# Patient Record
Sex: Female | Born: 1965
Health system: Southern US, Community
[De-identification: ages and names within clinical notes are randomized; demographics above are authoritative.]

## PROBLEM LIST (undated history)

## (undated) DIAGNOSIS — E785 Hyperlipidemia, unspecified: Secondary | ICD-10-CM

## (undated) DIAGNOSIS — E042 Nontoxic multinodular goiter: Secondary | ICD-10-CM

## (undated) DIAGNOSIS — E119 Type 2 diabetes mellitus without complications: Secondary | ICD-10-CM

## (undated) DIAGNOSIS — I10 Essential (primary) hypertension: Secondary | ICD-10-CM

## (undated) DIAGNOSIS — N6019 Diffuse cystic mastopathy of unspecified breast: Secondary | ICD-10-CM

## (undated) DIAGNOSIS — I509 Heart failure, unspecified: Secondary | ICD-10-CM

## (undated) HISTORY — DX: Essential (primary) hypertension: I10

## (undated) HISTORY — PX: TUBAL LIGATION: SHX77

## (undated) HISTORY — PX: ABDOMINAL HYSTERECTOMY: SHX81

## (undated) HISTORY — DX: Heart failure, unspecified: I50.9

## (undated) HISTORY — DX: Hyperlipidemia, unspecified: E78.5

---

## 2004-02-08 ENCOUNTER — Emergency Department: Payer: Self-pay | Admitting: Emergency Medicine

## 2004-07-10 ENCOUNTER — Emergency Department: Payer: Self-pay | Admitting: Emergency Medicine

## 2006-12-07 ENCOUNTER — Emergency Department: Payer: Self-pay | Admitting: Emergency Medicine

## 2007-12-30 ENCOUNTER — Ambulatory Visit: Payer: Self-pay | Admitting: Ophthalmology

## 2011-02-27 ENCOUNTER — Ambulatory Visit: Payer: Self-pay | Admitting: Internal Medicine

## 2011-03-21 ENCOUNTER — Other Ambulatory Visit: Payer: Self-pay | Admitting: General Surgery

## 2011-03-21 DIAGNOSIS — R922 Inconclusive mammogram: Secondary | ICD-10-CM

## 2011-03-21 DIAGNOSIS — R928 Other abnormal and inconclusive findings on diagnostic imaging of breast: Secondary | ICD-10-CM

## 2011-04-24 ENCOUNTER — Ambulatory Visit
Admission: RE | Admit: 2011-04-24 | Discharge: 2011-04-24 | Disposition: A | Discharge status: Home / Self Care | Payer: BC Managed Care – PPO | Source: Ambulatory Visit | Attending: General Surgery | Admitting: General Surgery

## 2011-04-24 DIAGNOSIS — R928 Other abnormal and inconclusive findings on diagnostic imaging of breast: Secondary | ICD-10-CM

## 2011-04-24 DIAGNOSIS — R922 Inconclusive mammogram: Secondary | ICD-10-CM

## 2011-04-24 MED ORDER — GADOBENATE DIMEGLUMINE 529 MG/ML IV SOLN
19.0000 mL | Freq: Once | INTRAVENOUS | Status: AC | PRN
  Administered 2011-04-24: 19 mL via INTRAVENOUS

## 2011-04-30 ENCOUNTER — Ambulatory Visit: Payer: Self-pay | Admitting: Surgery

## 2012-12-11 ENCOUNTER — Emergency Department: Payer: Self-pay | Admitting: Internal Medicine

## 2013-07-20 ENCOUNTER — Emergency Department: Payer: Self-pay | Admitting: Emergency Medicine

## 2013-07-20 LAB — COMPREHENSIVE METABOLIC PANEL
ALT: 16 U/L (ref 12–78)
ANION GAP: 8 (ref 7–16)
AST: 17 U/L (ref 15–37)
Albumin: 3.4 g/dL (ref 3.4–5.0)
Alkaline Phosphatase: 69 U/L
BUN: 13 mg/dL (ref 7–18)
Bilirubin,Total: 0.2 mg/dL (ref 0.2–1.0)
CALCIUM: 8.4 mg/dL — AB (ref 8.5–10.1)
CHLORIDE: 109 mmol/L — AB (ref 98–107)
CO2: 24 mmol/L (ref 21–32)
CREATININE: 0.81 mg/dL (ref 0.60–1.30)
GLUCOSE: 115 mg/dL — AB (ref 65–99)
Osmolality: 282 (ref 275–301)
POTASSIUM: 4 mmol/L (ref 3.5–5.1)
SODIUM: 141 mmol/L (ref 136–145)
Total Protein: 7.3 g/dL (ref 6.4–8.2)

## 2013-07-20 LAB — CBC WITH DIFFERENTIAL/PLATELET
Basophil #: 0.3 10*3/uL — ABNORMAL HIGH (ref 0.0–0.1)
Basophil %: 3 %
EOS PCT: 0.5 %
Eosinophil #: 0.1 10*3/uL (ref 0.0–0.7)
HCT: 33.8 % — AB (ref 35.0–47.0)
HGB: 10.4 g/dL — ABNORMAL LOW (ref 12.0–16.0)
LYMPHS ABS: 4.7 10*3/uL — AB (ref 1.0–3.6)
LYMPHS PCT: 48.4 %
MCH: 24 pg — AB (ref 26.0–34.0)
MCHC: 30.7 g/dL — ABNORMAL LOW (ref 32.0–36.0)
MCV: 78 fL — AB (ref 80–100)
MONOS PCT: 8.8 %
Monocyte #: 0.9 x10 3/mm (ref 0.2–0.9)
NEUTROS ABS: 3.8 10*3/uL (ref 1.4–6.5)
Neutrophil %: 39.3 %
PLATELETS: 399 10*3/uL (ref 150–440)
RBC: 4.32 10*6/uL (ref 3.80–5.20)
RDW: 17.6 % — ABNORMAL HIGH (ref 11.5–14.5)
WBC: 9.7 10*3/uL (ref 3.6–11.0)

## 2013-07-20 LAB — URINALYSIS, COMPLETE
BILIRUBIN, UR: NEGATIVE
BLOOD: NEGATIVE
Bacteria: NONE SEEN
Glucose,UR: NEGATIVE mg/dL (ref 0–75)
Ketone: NEGATIVE
LEUKOCYTE ESTERASE: NEGATIVE
Nitrite: NEGATIVE
Ph: 7 (ref 4.5–8.0)
Protein: NEGATIVE
SPECIFIC GRAVITY: 1.018 (ref 1.003–1.030)
Squamous Epithelial: 1

## 2013-07-20 LAB — GC/CHLAMYDIA PROBE AMP

## 2013-07-20 LAB — LIPASE, BLOOD: LIPASE: 152 U/L (ref 73–393)

## 2013-07-20 LAB — WET PREP, GENITAL

## 2013-07-29 ENCOUNTER — Ambulatory Visit: Payer: Self-pay | Admitting: Obstetrics and Gynecology

## 2013-07-29 LAB — BASIC METABOLIC PANEL
Anion Gap: 8 (ref 7–16)
BUN: 12 mg/dL (ref 7–18)
CREATININE: 0.75 mg/dL (ref 0.60–1.30)
Calcium, Total: 8.6 mg/dL (ref 8.5–10.1)
Chloride: 111 mmol/L — ABNORMAL HIGH (ref 98–107)
Co2: 22 mmol/L (ref 21–32)
EGFR (African American): >60
GLUCOSE: 86 mg/dL (ref 65–99)
Osmolality: 280 (ref 275–301)
Potassium: 3.9 mmol/L (ref 3.5–5.1)
SODIUM: 141 mmol/L (ref 136–145)

## 2013-07-29 LAB — HEMOGLOBIN: HGB: 10.6 g/dL — ABNORMAL LOW (ref 12.0–16.0)

## 2013-08-04 ENCOUNTER — Inpatient Hospital Stay: Payer: Self-pay

## 2013-08-05 LAB — BASIC METABOLIC PANEL
Anion Gap: 9 (ref 7–16)
BUN: 6 mg/dL — AB (ref 7–18)
Calcium, Total: 8.1 mg/dL — ABNORMAL LOW (ref 8.5–10.1)
Chloride: 110 mmol/L — ABNORMAL HIGH (ref 98–107)
Co2: 23 mmol/L (ref 21–32)
Creatinine: 0.73 mg/dL (ref 0.60–1.30)
EGFR (African American): >60
EGFR (Non-African Amer.): >60
Glucose: 128 mg/dL — ABNORMAL HIGH (ref 65–99)
Osmolality: 282 (ref 275–301)
Potassium: 4.2 mmol/L (ref 3.5–5.1)
SODIUM: 142 mmol/L (ref 136–145)

## 2013-08-05 LAB — HEMATOCRIT: HCT: 33.2 % — ABNORMAL LOW (ref 35.0–47.0)

## 2013-08-06 LAB — PATHOLOGY REPORT

## 2013-08-10 ENCOUNTER — Inpatient Hospital Stay: Payer: Self-pay | Admitting: Obstetrics and Gynecology

## 2013-08-10 LAB — CBC WITH DIFFERENTIAL/PLATELET
BASOS ABS: 0.1 10*3/uL (ref 0.0–0.1)
BASOS PCT: 1.3 %
Eosinophil #: 0 10*3/uL (ref 0.0–0.7)
Eosinophil %: 0.3 %
HCT: 38 % (ref 35.0–47.0)
HGB: 11.8 g/dL — AB (ref 12.0–16.0)
LYMPHS ABS: 2.2 10*3/uL (ref 1.0–3.6)
Lymphocyte %: 31.3 %
MCH: 24.2 pg — ABNORMAL LOW (ref 26.0–34.0)
MCHC: 30.9 g/dL — ABNORMAL LOW (ref 32.0–36.0)
MCV: 78 fL — ABNORMAL LOW (ref 80–100)
MONO ABS: 0.8 x10 3/mm (ref 0.2–0.9)
MONOS PCT: 11.4 %
NEUTROS ABS: 3.9 10*3/uL (ref 1.4–6.5)
Neutrophil %: 55.7 %
Platelet: 439 10*3/uL (ref 150–440)
RBC: 4.86 10*6/uL (ref 3.80–5.20)
RDW: 17.6 % — AB (ref 11.5–14.5)
WBC: 7.1 10*3/uL (ref 3.6–11.0)

## 2013-08-10 LAB — COMPREHENSIVE METABOLIC PANEL
ALK PHOS: 62 U/L
Albumin: 3.6 g/dL (ref 3.4–5.0)
Anion Gap: 9 (ref 7–16)
BUN: 10 mg/dL (ref 7–18)
Bilirubin,Total: 0.6 mg/dL (ref 0.2–1.0)
CALCIUM: 9.1 mg/dL (ref 8.5–10.1)
CHLORIDE: 105 mmol/L (ref 98–107)
CO2: 24 mmol/L (ref 21–32)
CREATININE: 0.87 mg/dL (ref 0.60–1.30)
EGFR (African American): >60
EGFR (Non-African Amer.): >60
Glucose: 112 mg/dL — ABNORMAL HIGH (ref 65–99)
Osmolality: 275 (ref 275–301)
POTASSIUM: 3.7 mmol/L (ref 3.5–5.1)
SGOT(AST): 12 U/L — ABNORMAL LOW (ref 15–37)
SGPT (ALT): 16 U/L
SODIUM: 138 mmol/L (ref 136–145)
TOTAL PROTEIN: 8 g/dL (ref 6.4–8.2)

## 2013-08-10 LAB — URINALYSIS, COMPLETE
Bacteria: NONE SEEN
Bilirubin,UR: NEGATIVE
Blood: NEGATIVE
GLUCOSE, UR: NEGATIVE mg/dL (ref 0–75)
LEUKOCYTE ESTERASE: NEGATIVE
Nitrite: NEGATIVE
Ph: 6 (ref 4.5–8.0)
Protein: NEGATIVE
Specific Gravity: 1.015 (ref 1.003–1.030)
Squamous Epithelial: 2

## 2013-08-10 LAB — LIPASE, BLOOD: Lipase: 69 U/L — ABNORMAL LOW (ref 73–393)

## 2013-08-11 LAB — CBC WITH DIFFERENTIAL/PLATELET
BASOS PCT: 0.4 %
Basophil #: 0 10*3/uL (ref 0.0–0.1)
Eosinophil #: 0 10*3/uL (ref 0.0–0.7)
Eosinophil %: 0.7 %
HCT: 32.5 % — ABNORMAL LOW (ref 35.0–47.0)
HGB: 10 g/dL — AB (ref 12.0–16.0)
LYMPHS PCT: 38.9 %
Lymphocyte #: 2.1 10*3/uL (ref 1.0–3.6)
MCH: 24 pg — ABNORMAL LOW (ref 26.0–34.0)
MCHC: 30.6 g/dL — ABNORMAL LOW (ref 32.0–36.0)
MCV: 78 fL — AB (ref 80–100)
MONOS PCT: 16.6 %
Monocyte #: 0.9 x10 3/mm (ref 0.2–0.9)
NEUTROS ABS: 2.3 10*3/uL (ref 1.4–6.5)
Neutrophil %: 43.4 %
PLATELETS: 350 10*3/uL (ref 150–440)
RBC: 4.15 10*6/uL (ref 3.80–5.20)
RDW: 17.7 % — ABNORMAL HIGH (ref 11.5–14.5)
WBC: 5.4 10*3/uL (ref 3.6–11.0)

## 2013-08-11 LAB — BASIC METABOLIC PANEL
ANION GAP: 8 (ref 7–16)
BUN: 11 mg/dL (ref 7–18)
CHLORIDE: 110 mmol/L — AB (ref 98–107)
Calcium, Total: 7.8 mg/dL — ABNORMAL LOW (ref 8.5–10.1)
Co2: 25 mmol/L (ref 21–32)
Creatinine: 0.78 mg/dL (ref 0.60–1.30)
Glucose: 110 mg/dL — ABNORMAL HIGH (ref 65–99)
OSMOLALITY: 285 (ref 275–301)
Potassium: 3.6 mmol/L (ref 3.5–5.1)
Sodium: 143 mmol/L (ref 136–145)

## 2013-08-12 LAB — BASIC METABOLIC PANEL WITH GFR
Anion Gap: 4 — ABNORMAL LOW
BUN: 9 mg/dL
Calcium, Total: 7.4 mg/dL — ABNORMAL LOW
Chloride: 110 mmol/L — ABNORMAL HIGH
Co2: 27 mmol/L
Creatinine: 0.81 mg/dL
EGFR (African American): >60
EGFR (Non-African Amer.): >60
Glucose: 120 mg/dL — ABNORMAL HIGH
Osmolality: 281
Potassium: 3.7 mmol/L
Sodium: 141 mmol/L

## 2014-04-24 NOTE — Op Note (Signed)
PATIENT NAME:  Michele Freeman, RETTIG MR#:  [id number] DATE OF BIRTH:  13-Feb-1965  DATE OF PROCEDURE:  08/04/2013  PREOPERATIVE DIAGNOSES:  1.  Symptomatic fibroid uterus.  2.  Menorrhagia.   POSTOPERATIVE DIAGNOSES:  1.  Symptomatic fibroid uterus.  3.  Menorrhagia.  4.  Left hydrosalpinx.   PROCEDURE: Total abdominal hysterectomy, right salpingectomy.   ANESTHESIA: General endotracheal.   SURGEON:  Suzy Bouchard, MD.   FIRST ASSISTANT:  Maxine Glenn, MD.  INDICATIONS: This is a 49 year old female with a 24-week-size uterus consistent with fibroids. Patient with a history of menorrhagia.   FINDINGS:  1.  A 24-week-size uterus with multiple fibroids.  2.  Left hydrosalpinx with no discernible fimbria.   PROCEDURE: After adequate general endotracheal anesthesia the patient was placed in the dorsal supine position with the legs in the Atkins stirrups. An abdominal-perineal-vaginal prep was performed, a Foley catheter was placed and the patient did receive 2 grams IV cefoxitin prior to commencement of the case.   After the patient was prepped and draped a vertical incision was made from the symphysis pubis to the umbilicus. Sharp dissection was used to identify the fascia. The fascia was opened in the midline and entry into the peritoneal cavity was accomplished sharply. Given the size of the uterus the incision was extended periumbilically. The uterus was retrieved with large fibroids noted and brought through the abdominal incision. The round ligaments were bilaterally clamped, transected, and suture ligated with 0-Vicryl suture. Windows were made in the broad ligaments bilaterally and Heaney clamps were passed through the window incorporating the uteroovarian ligament and the proximal portion of the fallopian tube.  Each of these pedicles were transected and doubly ligated with 0-Vicryl suture. Good hemostasis was noted. The uterine arteries were bilaterally skeletonized and then  bilaterally clamped with Heaney clamps, transected, and suture ligated with 0-Vicryl suture.   Peritoneal reflection was dissected free from the lower uterine segment, and from the cervix. Given the size of the uterus, the uterus was amputated and removed from the operative field. A double-tooth tenaculum was applied to the cervix and placed on traction.  The cardinal ligaments were clamped with straight Heaney clamps, transected, suture ligated with 0-Vicryl suture. Ultimately, the vaginal cuff angles were clamped with curved Heaney clamps and the rest of the cervix was removed once entrance into the vagina was accomplished. Angles were closed with 0-Vicryl suture and the remainder of the cuff was closed with interrupted 0-Vicryl suture. Good hemostasis was noted. The ureters appeared normal bilaterally with normal peristaltic activity.   Attention was directed to the patient's right fallopian tube which was grasped at the fimbriated end and a Heaney clamp was placed at the mesosalpinx and the fallopian tube was removed and the pedicles ligated with 0-Vicryl suture. The left fallopian tube appeared to have a hydrosalpinx with no discernible fimbriated end, therefore dissection did not occur and the fallopian tube was left in situ. The ovaries appeared normal.   The patient's abdomen was copiously irrigated. Good hemostasis was noted. All pedicle sutures were removed. Sponge and needle count were correct. The abdominal wall was closed with a #1 looped PDS suture incorporating the fascia and the muscle and the peritoneum in a modified Smead-Jones fashion. Good approximation of the fascia. Subcutaneous tissues were irrigated and Bovied for hemostasis and given the depth of the subcutaneous tissues, approximately 5 cm, the subcutaneous tissue was closed with a running 2-0 chromic suture to close dead space. The skin was reapproximated  with staples. There were no complications. The patient tolerated the procedure  well.   ESTIMATED BLOOD LOSS: 100 mL.   INTRAOPERATIVE FLUIDS: 1400 mL.   URINE OUTPUT:  250 mL.    The patient tolerated the procedure well and was taken to the recovery room in good condition.    ____________________________ Suzy Bouchardhomas J. Schermerhorn, MD tjs:lt D: 08/04/2013 15:50:36 ET T: 08/04/2013 16:58:24 ET JOB#: [id number]  cc: Suzy Bouchardhomas J. Schermerhorn, MD, <Dictator> Suzy BouchardHOMAS J SCHERMERHORN MD ELECTRONICALLY SIGNED 08/11/2013 9:05

## 2014-04-24 NOTE — H&P (Signed)
PATIENT NAME:  Michele Freeman, Michele Freeman MR#:  [id number] DATE OF BIRTH:  1965/05/13  DATE OF ADMISSION:  08/10/2013  HISTORY OF PRESENT ILLNESS: This is a 49 year old G2, P2. Patient is status post an abdominal hysterectomy by me 6 days ago. The procedure was uncomplicated. The indication for the procedure was large symptomatic fibroid uterus. The patient was discharged home postoperative day number 3 with some nausea and vomiting, which progressed into today. The patient today is having nausea, vomiting, and left lower back pain. The patient was seen in the Emergency Department and underwent a CT scan which showed a dilated proximal and mid small bowel loops with collapse. Distal small bowel loops compatible with a small bowel obstruction. The rest of the CT scan was unremarkable. This was done with contrast. The patient denies fever. The patient did state that she is having bowel movements and passing gas.   PAST MEDICAL HISTORY: Fibrocystic breast disease and thyroid nodules.   PAST SURGICAL HISTORY:  1. Bilateral tubal ligation.  2. Cesarean section. 3. Total abdominal hysterectomy. 4. Bilateral salpingectomies.   FAMILY HISTORY: Unremarkable.   REVIEW OF SYSTEMS: Unremarkable.   ALLERGIES: No known drug allergies.   MEDICATIONS: Percocet.   SOCIAL HISTORY: Does not smoke, does not drink.   PHYSICAL EXAMINATION:  GENERAL: Well-developed, well-nourished slightly ill-appearing black female. NG tube in place.  LUNGS: Clear to auscultation.  CARDIOVASCULAR: Regular rate and rhythm. ABDOMEN: Mild CVA tenderness on the left. Bowel slightly distended, normoactive bowel sounds. Abdomen is soft. No definitive tenderness. Incision is clean, dry, and intact. No erythema.  PELVIC: Examination is deferred. VITALS SIGNS: Temperature 98.1, blood pressure 173/114, respirations 18, pulse of 83.   LABORATORY DATA: Laboratory chemistry panel: A BUN of 10 and creatinine of 0.87, sodium 138, potassium 3.7,  total bilirubin 0.6, ALT 16, AST of 12. Lipase 69. CBC white blood count of 7.1, hemoglobin 11.8, and platelet count 439,000. Urinalysis: Specific gravity 1.015, pH 6.0, negative nitrite, negative leukocyte esterase, and unremarkable microscopic evaluation.   ASSESSMENT: Six days postoperative with abdominal pain, nausea, and vomiting consistent with ileus versus small bowel obstruction. A CT scan did not make any specific notification regarding abnormalities for ureters or hydronephrosis.   PLANS: [facility] NG tube will continue on continuous suction for gastric decompression. The patient's output will be replaced with normal saline with 20 mEq  <<cc per cc>>, Toradol will be given for pain relief and 30 mg IV every 8 hours p.r.n. and Demerol and Phenergan, 50 mg of Demerol 12.5 mg of Phenergan q. 6 hours p.r.n. Conservative management with IV fluid hydration. D5 LR at 125 mL per hour. If the patient's white blood count becomes more elevated or abdominal exam worsens then general surgery consult will be obtained for the possibility of small bowel obstruction. The patient and husband understand recommendations and plan of action and agreed to this.    ____________________________ Suzy Bouchardhomas J. Schermerhorn, MD tjs:lt D: 08/10/2013 20:31:43 ET T: 08/10/2013 21:01:01 ET JOB#: [id number]  cc: Suzy Bouchardhomas J. Schermerhorn, MD, <Dictator> Suzy BouchardHOMAS J SCHERMERHORN MD ELECTRONICALLY SIGNED 08/11/2013 9:07

## 2014-09-28 ENCOUNTER — Other Ambulatory Visit: Payer: Self-pay | Admitting: Physician Assistant

## 2014-09-28 DIAGNOSIS — E010 Iodine-deficiency related diffuse (endemic) goiter: Secondary | ICD-10-CM

## 2014-10-12 ENCOUNTER — Encounter: Payer: Self-pay | Admitting: Dietician

## 2014-10-12 ENCOUNTER — Encounter: Payer: BLUE CROSS/BLUE SHIELD | Attending: Physician Assistant | Admitting: Dietician

## 2014-10-12 VITALS — BP 144/91 | Ht 68.0 in | Wt 211.0 lb

## 2014-10-12 DIAGNOSIS — E119 Type 2 diabetes mellitus without complications: Secondary | ICD-10-CM | POA: Diagnosis not present

## 2014-10-12 NOTE — Progress Notes (Signed)
Diabetes Self-Management Education  Visit Type: First/Initial  Appt. Start Time: 1345 Appt. End Time: 1445  10/12/2014  Ms. [facility], identified by name and date of birth, is a 49 y.o. female with a diagnosis of Diabetes: Type 2.   ASSESSMENT  Blood pressure 144/91, height  (1.727 m), weight 211 lb (95.709 kg). Body mass index is 32.09 kg/(m^2). Overweight Smokes 1/2 ppd Rough dry skin noted between left 3rd and 4th toes     Diabetes Self-Management Education - 10/12/14 1504    Visit Information   Visit Type First/Initial   Initial Visit   Diabetes Type Type 2   Health Coping   How would you rate your overall health? Good   Psychosocial Assessment   Patient Belief/Attitude about Diabetes Motivated to manage diabetes   Self-care barriers None   Patient Concerns Healthy Lifestyle;Glycemic Control;Weight Control  quit smoking; prevent complications   Special Needs None   Preferred Learning Style Auditory;Hands on   Learning Readiness Ready   What is the last grade level you completed in school? 12   Complications   How often do you check your blood sugar? > 4 times/day   Fasting Blood glucose range (mg/dL) 16-109;604-540   Postprandial Blood glucose range (mg/dL) 98-119;147-829;562-130;>865   Have you had a dilated eye exam in the past 12 months? Yes   Have you had a dental exam in the past 12 months? Yes   Are you checking your feet? Yes   How many days per week are you checking your feet? 7   Dietary Intake   Breakfast --  eats 3 meals/day   Snack (morning) --  eats snack at 10am   Snack (afternoon) --  eats snack at 3p-popcorn/nuts   Beverage(s) --  drinks 6-7 glasses of water/day   Exercise   Exercise Type --  walks 30-40 min 3x/wk   How many days per week to you exercise? 3   Patient Education   Previous Diabetes Education No   Disease state  Definition of diabetes, type 1 and 2, and the diagnosis of diabetes;Factors that contribute to the  development of diabetes   Nutrition management  Role of diet in the treatment of diabetes and the relationship between the three main macronutrients and blood glucose level;Food label reading, portion sizes and measuring food.;Carbohydrate counting;Meal timing in regards to the patients' current diabetes medication.   Physical activity and exercise  Role of exercise on diabetes management, blood pressure control and cardiac health.   Medications Reviewed patients medication for diabetes, action, purpose, timing of dose and side effects.   Monitoring Purpose and frequency of SMBG.;Identified appropriate SMBG and/or A1C goals.;Yearly dilated eye exam   Chronic complications Relationship between chronic complications and blood glucose control;Dental care   Personal strategies to promote health Lifestyle issues that need to be addressed for better diabetes care;Review risk of smoking and offered smoking cessation      Individualized Plan for Diabetes Self-Management Training:   Learning Objective:  Patient will have a greater understanding of diabetes self-management. Patient education plan is to attend individual and/or group sessions per assessed needs and concerns. Improve BG's  Lose weight Lead healthier lifestyle Quit smoking Prevent diabetes complications   Plan:   Patient Instructions   Check blood sugars 2 x day before breakfast and 2 hrs after supper every day  Exercise:  Increase walking to 30 min 4-5x/wk.   Avoid sugar sweetened drinks (soda, tea, coffee, sports drinks, juices)  Limit intake of  sweets and fried foods  Eat 3 meals day,  1-2 snacks a day (afternoon &/or bedtime)  Eat  2-3 carb servings/meal + protein  Eat 1 carb serving/snack + protein  Space meals 4-6 hours apart  Quit  smoking  Get a Transport planner  Return for appointment/classes on:  Class 1 on 10-25-14 Use moisturizer cream on feet daily-see podiatrist if needed   Education material  provided: [employer] Guidelines  If problems or questions, patient to contact team via:  [phone]  Future DSME appointment:  10-25-14

## 2014-10-12 NOTE — Patient Instructions (Addendum)
  Check blood sugars 2 x day before breakfast and 2 hrs after supper every day  Exercise:  Increase walking to 30 min 4-5x/wk.   Avoid sugar sweetened drinks (soda, tea, coffee, sports drinks, juices)  Limit intake of sweets and fried foods  Eat 3 meals day,  1-2 snacks a day (afternoon &/or bedtime)  Eat  2-3 carb servings/meal + protein  Eat 1 carb serving/snack + protein  Space meals 4-6 hours apart  Quit  smoking  Get a Veterinary surgeon mosturizer cream to left foot-see podiatrist if needed  Return for appointment/classes on:  Class 1 on 10-25-14

## 2014-10-27 ENCOUNTER — Telehealth: Payer: Self-pay | Admitting: Dietician

## 2014-10-27 NOTE — Telephone Encounter (Signed)
Called patient to reschedule classes after she missed class 1 on 10/25/14. Rescheduled series to begin on 11/29/14. Patient requests copy of class schedule to be sent to her by mail.

## 2014-11-02 ENCOUNTER — Other Ambulatory Visit: Payer: Self-pay | Admitting: Internal Medicine

## 2014-11-02 DIAGNOSIS — E0520 Thyrotoxicosis with toxic multinodular goiter without thyrotoxic crisis or storm: Secondary | ICD-10-CM

## 2014-11-02 DIAGNOSIS — E0590 Thyrotoxicosis, unspecified without thyrotoxic crisis or storm: Secondary | ICD-10-CM

## 2014-11-08 ENCOUNTER — Ambulatory Visit: Payer: BLUE CROSS/BLUE SHIELD

## 2014-11-15 ENCOUNTER — Ambulatory Visit: Payer: BLUE CROSS/BLUE SHIELD

## 2014-11-15 ENCOUNTER — Encounter
Admission: RE | Admit: 2014-11-15 | Discharge: 2014-11-15 | Disposition: A | Discharge status: Home / Self Care | Payer: BLUE CROSS/BLUE SHIELD | Source: Ambulatory Visit | Attending: Internal Medicine | Admitting: Internal Medicine

## 2014-11-15 DIAGNOSIS — E0520 Thyrotoxicosis with toxic multinodular goiter without thyrotoxic crisis or storm: Secondary | ICD-10-CM | POA: Diagnosis present

## 2014-11-15 DIAGNOSIS — E0590 Thyrotoxicosis, unspecified without thyrotoxic crisis or storm: Secondary | ICD-10-CM | POA: Diagnosis present

## 2014-11-15 MED ORDER — SODIUM IODIDE I-123 3.7 MBQ PO CAPS
136.7000 | ORAL_CAPSULE | Freq: Once | ORAL | Status: AC
  Administered 2014-11-15: 136.7 via ORAL
  Filled 2014-11-15: qty 1

## 2014-11-16 ENCOUNTER — Encounter: Admission: RE | Admit: 2014-11-16 | Payer: BLUE CROSS/BLUE SHIELD | Source: Ambulatory Visit

## 2014-11-22 ENCOUNTER — Other Ambulatory Visit: Payer: Self-pay | Admitting: Physician Assistant

## 2014-11-22 DIAGNOSIS — N63 Unspecified lump in unspecified breast: Secondary | ICD-10-CM

## 2014-11-29 ENCOUNTER — Ambulatory Visit
Admission: RE | Admit: 2014-11-29 | Discharge: 2014-11-29 | Disposition: A | Discharge status: Home / Self Care | Payer: BLUE CROSS/BLUE SHIELD | Source: Ambulatory Visit | Attending: Physician Assistant | Admitting: Physician Assistant

## 2014-11-29 ENCOUNTER — Other Ambulatory Visit: Payer: Self-pay | Admitting: Physician Assistant

## 2014-11-29 ENCOUNTER — Ambulatory Visit: Payer: BLUE CROSS/BLUE SHIELD

## 2014-11-29 DIAGNOSIS — N63 Unspecified lump in unspecified breast: Secondary | ICD-10-CM

## 2014-11-29 DIAGNOSIS — N644 Mastodynia: Secondary | ICD-10-CM | POA: Insufficient documentation

## 2014-11-29 DIAGNOSIS — N6001 Solitary cyst of right breast: Secondary | ICD-10-CM | POA: Insufficient documentation

## 2014-11-30 ENCOUNTER — Telehealth: Payer: Self-pay | Admitting: Dietician

## 2014-11-30 NOTE — Telephone Encounter (Signed)
Called patient to reschedule class missed yesterday, 11/29/14. No answer, and no voicemail.  This is the second missed class series.

## 2014-12-01 ENCOUNTER — Other Ambulatory Visit: Payer: Self-pay | Admitting: Physician Assistant

## 2014-12-01 DIAGNOSIS — N6310 Unspecified lump in the right breast, unspecified quadrant: Secondary | ICD-10-CM

## 2014-12-06 ENCOUNTER — Encounter: Payer: Self-pay | Admitting: Dietician

## 2014-12-06 ENCOUNTER — Ambulatory Visit: Payer: BLUE CROSS/BLUE SHIELD

## 2014-12-06 NOTE — Progress Notes (Signed)
Pt did not come to class 1 on 11-29-14. Called pt but no answer and unable to leave a message-will discharge from program

## 2014-12-08 ENCOUNTER — Ambulatory Visit
Admission: RE | Admit: 2014-12-08 | Discharge: 2014-12-08 | Disposition: A | Discharge status: Home / Self Care | Payer: BLUE CROSS/BLUE SHIELD | Source: Ambulatory Visit | Attending: Physician Assistant | Admitting: Physician Assistant

## 2014-12-08 DIAGNOSIS — N6310 Unspecified lump in the right breast, unspecified quadrant: Secondary | ICD-10-CM

## 2014-12-08 DIAGNOSIS — N63 Unspecified lump in breast: Secondary | ICD-10-CM | POA: Diagnosis not present

## 2014-12-08 HISTORY — PX: BREAST BIOPSY: SHX20

## 2014-12-08 HISTORY — PX: BREAST CYST ASPIRATION: SHX578

## 2014-12-09 ENCOUNTER — Ambulatory Visit: Payer: BLUE CROSS/BLUE SHIELD

## 2014-12-09 ENCOUNTER — Other Ambulatory Visit: Payer: BLUE CROSS/BLUE SHIELD

## 2014-12-09 LAB — SURGICAL PATHOLOGY

## 2014-12-13 ENCOUNTER — Ambulatory Visit: Payer: BLUE CROSS/BLUE SHIELD

## 2016-01-11 ENCOUNTER — Emergency Department
Admission: EM | Admit: 2016-01-11 | Discharge: 2016-01-11 | Disposition: A | Discharge status: Home / Self Care | Payer: BLUE CROSS/BLUE SHIELD | Attending: Student in an Organized Health Care Education/Training Program | Admitting: Student in an Organized Health Care Education/Training Program

## 2016-01-11 DIAGNOSIS — B349 Viral infection, unspecified: Secondary | ICD-10-CM | POA: Insufficient documentation

## 2016-01-11 DIAGNOSIS — F17210 Nicotine dependence, cigarettes, uncomplicated: Secondary | ICD-10-CM | POA: Insufficient documentation

## 2016-01-11 DIAGNOSIS — Z7984 Long term (current) use of oral hypoglycemic drugs: Secondary | ICD-10-CM | POA: Insufficient documentation

## 2016-01-11 LAB — POCT RAPID STREP A: STREPTOCOCCUS, GROUP A SCREEN (DIRECT): NEGATIVE

## 2016-01-11 MED ORDER — ONDANSETRON 4 MG PO TBDP: 4.0000 mg | ORAL_TABLET | Freq: Three times a day (TID) | ORAL | 0 refills | Status: DC | PRN

## 2016-01-11 MED ORDER — FLUTICASONE PROPIONATE 50 MCG/ACT NA SUSP: 1.0000 | Freq: Two times a day (BID) | NASAL | 0 refills | Status: AC

## 2016-01-11 MED ORDER — PSEUDOEPH-BROMPHEN-DM 30-2-10 MG/5ML PO SYRP: 10.0000 mL | ORAL_SOLUTION | Freq: Four times a day (QID) | ORAL | 0 refills | Status: DC | PRN

## 2016-01-11 MED ORDER — CETIRIZINE HCL 10 MG PO TABS: 10.0000 mg | ORAL_TABLET | Freq: Every day | ORAL | 0 refills | Status: AC

## 2016-01-11 NOTE — ED Triage Notes (Addendum)
Pt reports to ED w/ c/o nasal congestion, non-productive cough and sore throat.  Pt reports +PO intake, but diarrhea that started today.  Pt A/Ox4, denies fever. NAD.  Resp even and unlabored. Able to speak in full sentences w/o issue

## 2016-01-11 NOTE — ED Notes (Signed)
PA aware of pt's BP.  

## 2016-01-11 NOTE — ED Notes (Signed)
Pt verbalizes understanding of discharge instructions.

## 2016-01-12 NOTE — ED Provider Notes (Signed)
[facility] Emergency Department Provider Note  __________________[facility]__________________________  Time seen: Approximately 9:00 PM  I have reviewed the triage vital signs and the nursing notes.   HISTORY  Chief Complaint Nasal Congestion    HPI Michele Freeman is a 51 y.o. female presents emergency department complaining of 5 day history of nasal congestion, sore throat, cough, emesis, diarrhea. Patient states that symptoms began insidiously. Initial symptoms were nasal congestion progressing to other symptoms. Patient has had 2 episodes of diarrhea and emesis. Patient is able to maintain good oral intake of fluids and solids. Patienthas had multiple sick contacts within the family. She is tried Mucinex and Alka-Seltzer with some relief but not complete relief. No other complaints at this time. Patient denies any headache, visual changes, neck pain, chest pain, shortness of breath, abdominal pain.   History reviewed. No pertinent past medical history.  There are no active problems to display for this patient.   Past Surgical History:  Procedure Laterality Date  . BREAST BIOPSY Right 12/08/2014   path pending  . BREAST CYST ASPIRATION Right 12/08/2014    Prior to Admission medications   Medication Sig Start Date End Date Taking? Authorizing Provider  brompheniramine-pseudoephedrine-DM 30-2-10 MG/5ML syrup Take 10 mLs by mouth 4 (four) times daily as needed. 01/11/16   Delorise RoyalsJonathan D Shaneice Barsanti, PA-C  cetirizine (ZYRTEC) 10 MG tablet Take 1 tablet (10 mg total) by mouth daily. 01/11/16   Delorise RoyalsJonathan D Christen Wardrop, PA-C  fluticasone (FLONASE) 50 MCG/ACT nasal spray Place 1 spray into both nostrils 2 (two) times daily. 01/11/16   Delorise RoyalsJonathan D Kira Hartl, PA-C  metFORMIN (GLUCOPHAGE) 500 MG tablet Take 1 tablet by mouth 2 (two) times daily. 09/28/14 09/28/15  Historical Provider, MD  ondansetron (ZOFRAN-ODT) 4 MG disintegrating tablet Take 1 tablet (4 mg total) by mouth every 8  (eight) hours as needed for nausea or vomiting. 01/11/16   Delorise RoyalsJonathan D Lenka Zhao, PA-C    Allergies No known allergies  Family History  Problem Relation Age of Onset  . Breast cancer Neg Hx     Social History Social History  Substance Use Topics  . Smoking status: Current Every Day Smoker    Packs/day: 0.50    Types: Cigarettes  . Smokeless tobacco: Never Used     Comment: gave information on free  Quit Smoking classes  . Alcohol use 1.2 oz/week    2 Standard drinks or equivalent per week     Review of Systems  Constitutional: No fever/chills Eyes: No visual changes. No discharge ENT: Positive for nasal congestion and sore throat. Cardiovascular: no chest pain. Respiratory: Positive for dry cough. No SOB. Gastrointestinal: No abdominal pain.  Positive for one day history of nausea and vomiting..  2 episodes of diarrhea.  No constipation. Musculoskeletal: Negative for musculoskeletal pain. Skin: Negative for rash, abrasions, lacerations, ecchymosis. Neurological: Negative for headaches, focal weakness or numbness. 10-point ROS otherwise negative.  ____________________________________________   PHYSICAL EXAM:  VITAL SIGNS: ED Triage Vitals  Enc Vitals Group     BP 01/11/16 1945 (!) 166/109     Pulse Rate 01/11/16 1944 100     Resp 01/11/16 1944 18     Temp 01/11/16 1944 98.8 F (37.1 C)     Temp Source 01/11/16 1944 Oral     SpO2 01/11/16 1944 99 %     Weight 01/11/16 1944 200 lb (90.7 kg)     Height 01/11/16 1944 5\' 8"  (1.727 m)     Head Circumference --  Peak Flow --      Pain Score 01/11/16 1945 10     Pain Loc --      Pain Edu? --      Excl. in GC? --      Constitutional: Alert and oriented. Well appearing and in no acute distress. Eyes: Conjunctivae are normal. PERRL. EOMI. Head: Atraumatic. ENT:      Ears: EACs are unremarkable bilaterally. TMs are mildly bulging bilaterally.      Nose: Moderate purulent congestion/rhinnorhea.       Mouth/Throat: Mucous membranes are moist. Oropharynx is mildly erythematous but nonedematous. Uvula is midline. Tonsils are mildly erythematous but nonedematous and no exudates. Neck: No stridor.   Hematological/Lymphatic/Immunilogical: No cervical lymphadenopathy. Cardiovascular: Normal rate, regular rhythm. Normal S1 and S2.  Good peripheral circulation. Respiratory: Normal respiratory effort without tachypnea or retractions. Lungs CTAB. Good air entry to the bases with no decreased or absent breath sounds. Gastrointestinal: Bowel sounds 4 quadrants. Soft and nontender to palpation. No guarding or rigidity. No palpable masses. No distention. No CVA tenderness. Musculoskeletal: Full range of motion to all extremities. No gross deformities appreciated. Neurologic:  Normal speech and language. No gross focal neurologic deficits are appreciated.  Skin:  Skin is warm, dry and intact. No rash noted. Psychiatric: Mood and affect are normal. Speech and behavior are normal. Patient exhibits appropriate insight and judgement.   ____________________________________________   LABS (all labs ordered are listed, but only abnormal results are displayed)  Labs Reviewed  POCT RAPID STREP A   ____________________________________________  EKG   ____________________________________________  RADIOLOGY   No results found.  ____________________________________________    PROCEDURES  Procedure(s) performed:    Procedures    Medications - No data to display   ____________________________________________   INITIAL IMPRESSION / ASSESSMENT AND PLAN / ED COURSE  Pertinent labs & imaging results that were available during my care of the patient were reviewed by me and considered in my medical decision making (see chart for details).  Review of the [place] CSRS was performed in accordance of the [facility] prior to dispensing any controlled drugs.  Clinical Course     Patient's diagnosis is  consistent with Viral illness. Patient's symptoms are consistent with viral illness starting with upper respiratory complaints followed by mild GI complaints. Patient's exam was reassuring with no indication for further labs or imaging.. Patient will be discharged home with prescriptions for worsening control medications. Patient is to follow up with primary care as needed or otherwise directed. Patient is given ED precautions to return to the ED for any worsening or new symptoms.     ____________________________________________  FINAL CLINICAL IMPRESSION(S) / ED DIAGNOSES  Final diagnoses:  Viral illness      NEW MEDICATIONS STARTED DURING THIS VISIT:  Discharge Medication List as of 01/11/2016  9:01 PM    START taking these medications   Details  brompheniramine-pseudoephedrine-DM 30-2-10 MG/5ML syrup Take 10 mLs by mouth 4 (four) times daily as needed., Starting Wed 01/11/2016, Print    cetirizine (ZYRTEC) 10 MG tablet Take 1 tablet (10 mg total) by mouth daily., Starting Wed 01/11/2016, Print    fluticasone (FLONASE) 50 MCG/ACT nasal spray Place 1 spray into both nostrils 2 (two) times daily., Starting Wed 01/11/2016, Print    ondansetron (ZOFRAN-ODT) 4 MG disintegrating tablet Take 1 tablet (4 mg total) by mouth every 8 (eight) hours as needed for nausea or vomiting., Starting Wed 01/11/2016, Print  This chart was dictated using voice recognition software/Dragon. Despite best efforts to proofread, errors can occur which can change the meaning. Any change was purely unintentional.    Racheal Patches, PA-C 01/12/16 0144    Willy Eddy, MD 01/12/16 0157

## 2016-01-31 ENCOUNTER — Emergency Department: Payer: No Typology Code available for payment source

## 2016-01-31 ENCOUNTER — Observation Stay (HOSPITAL_BASED_OUTPATIENT_CLINIC_OR_DEPARTMENT_OTHER)
Admit: 2016-01-31 | Discharge: 2016-01-31 | Disposition: A | Discharge status: Home / Self Care | Payer: No Typology Code available for payment source | Attending: Internal Medicine | Admitting: Internal Medicine

## 2016-01-31 ENCOUNTER — Observation Stay
Admission: EM | Admit: 2016-01-31 | Discharge: 2016-02-01 | Disposition: A | Discharge status: Home / Self Care | Payer: No Typology Code available for payment source | Attending: Internal Medicine | Admitting: Internal Medicine

## 2016-01-31 ENCOUNTER — Encounter: Payer: Self-pay | Admitting: Emergency Medicine

## 2016-01-31 DIAGNOSIS — I5021 Acute systolic (congestive) heart failure: Secondary | ICD-10-CM

## 2016-01-31 DIAGNOSIS — Z8249 Family history of ischemic heart disease and other diseases of the circulatory system: Secondary | ICD-10-CM | POA: Insufficient documentation

## 2016-01-31 DIAGNOSIS — R0602 Shortness of breath: Secondary | ICD-10-CM | POA: Diagnosis present

## 2016-01-31 DIAGNOSIS — Z7984 Long term (current) use of oral hypoglycemic drugs: Secondary | ICD-10-CM | POA: Insufficient documentation

## 2016-01-31 DIAGNOSIS — E119 Type 2 diabetes mellitus without complications: Secondary | ICD-10-CM | POA: Diagnosis not present

## 2016-01-31 DIAGNOSIS — I110 Hypertensive heart disease with heart failure: Secondary | ICD-10-CM | POA: Diagnosis not present

## 2016-01-31 DIAGNOSIS — I509 Heart failure, unspecified: Secondary | ICD-10-CM

## 2016-01-31 DIAGNOSIS — Z7951 Long term (current) use of inhaled steroids: Secondary | ICD-10-CM | POA: Diagnosis not present

## 2016-01-31 DIAGNOSIS — Z79899 Other long term (current) drug therapy: Secondary | ICD-10-CM | POA: Insufficient documentation

## 2016-01-31 DIAGNOSIS — F17210 Nicotine dependence, cigarettes, uncomplicated: Secondary | ICD-10-CM | POA: Insufficient documentation

## 2016-01-31 HISTORY — DX: Type 2 diabetes mellitus without complications: E11.9

## 2016-01-31 LAB — COMPREHENSIVE METABOLIC PANEL
ALBUMIN: 4 g/dL (ref 3.5–5.0)
ALT: 28 U/L (ref 14–54)
ANION GAP: 6 (ref 5–15)
AST: 22 U/L (ref 15–41)
Alkaline Phosphatase: 85 U/L (ref 38–126)
BUN: 17 mg/dL (ref 6–20)
CHLORIDE: 109 mmol/L (ref 101–111)
CO2: 25 mmol/L (ref 22–32)
Calcium: 8.9 mg/dL (ref 8.9–10.3)
Creatinine, Ser: 0.8 mg/dL (ref 0.44–1.00)
GFR calc Af Amer: >60 mL/min (ref >60)
GFR calc non Af Amer: >60 mL/min (ref >60)
GLUCOSE: 170 mg/dL — AB (ref 65–99)
POTASSIUM: 3.7 mmol/L (ref 3.5–5.1)
SODIUM: 140 mmol/L (ref 135–145)
Total Bilirubin: 0.7 mg/dL (ref 0.3–1.2)
Total Protein: 7.1 g/dL (ref 6.5–8.1)

## 2016-01-31 LAB — GLUCOSE, CAPILLARY
Glucose-Capillary: 189 mg/dL — ABNORMAL HIGH (ref 65–99)
Glucose-Capillary: 142 mg/dL — ABNORMAL HIGH (ref 65–99)
Glucose-Capillary: 96 mg/dL (ref 65–99)
Glucose-Capillary: 95 mg/dL (ref 65–99)

## 2016-01-31 LAB — CBC WITH DIFFERENTIAL/PLATELET
BASOS PCT: 1 %
Basophils Absolute: 0 10*3/uL (ref 0–0.1)
EOS ABS: 0 10*3/uL (ref 0–0.7)
Eosinophils Relative: 0 %
HCT: 45.2 % (ref 35.0–47.0)
HEMOGLOBIN: 15.5 g/dL (ref 12.0–16.0)
Lymphocytes Relative: 37 %
Lymphs Abs: 3.4 10*3/uL (ref 1.0–3.6)
MCH: 30.7 pg (ref 26.0–34.0)
MCHC: 34.2 g/dL (ref 32.0–36.0)
MCV: 89.7 fL (ref 80.0–100.0)
MONO ABS: 0.6 10*3/uL (ref 0.2–0.9)
MONOS PCT: 7 %
NEUTROS PCT: 55 %
Neutro Abs: 5 10*3/uL (ref 1.4–6.5)
Platelets: 376 10*3/uL (ref 150–440)
RBC: 5.05 MIL/uL (ref 3.80–5.20)
RDW: 13.4 % (ref 11.5–14.5)
WBC: 9 10*3/uL (ref 3.6–11.0)

## 2016-01-31 LAB — TSH: TSH: 0.234 u[IU]/mL — ABNORMAL LOW (ref 0.350–4.500)

## 2016-01-31 LAB — TROPONIN I: Troponin I: <0.03 ng/mL (ref <0.03)

## 2016-01-31 LAB — BRAIN NATRIURETIC PEPTIDE: B Natriuretic Peptide: 582 pg/mL — ABNORMAL HIGH (ref 0.0–100.0)

## 2016-01-31 MED ORDER — INSULIN ASPART 100 UNIT/ML ~~LOC~~ SOLN
0.0000 [IU] | Freq: Three times a day (TID) | SUBCUTANEOUS | Status: DC
  Administered 2016-01-31 – 2016-02-01 (×2): 2 [IU] via SUBCUTANEOUS
  Filled 2016-01-31 (×2): qty 2

## 2016-01-31 MED ORDER — IPRATROPIUM-ALBUTEROL 0.5-2.5 (3) MG/3ML IN SOLN
3.0000 mL | Freq: Once | RESPIRATORY_TRACT | Status: AC
  Administered 2016-01-31: 3 mL via RESPIRATORY_TRACT
  Filled 2016-01-31: qty 3

## 2016-01-31 MED ORDER — INFLUENZA VAC SPLIT QUAD 0.5 ML IM SUSY
0.5000 mL | PREFILLED_SYRINGE | INTRAMUSCULAR | Status: AC
  Administered 2016-02-01: 0.5 mL via INTRAMUSCULAR
  Filled 2016-01-31: qty 0.5

## 2016-01-31 MED ORDER — FUROSEMIDE 10 MG/ML IJ SOLN
40.0000 mg | Freq: Once | INTRAMUSCULAR | Status: AC
  Administered 2016-01-31: 40 mg via INTRAVENOUS
  Filled 2016-01-31: qty 4

## 2016-01-31 MED ORDER — ONDANSETRON HCL 4 MG PO TABS
4.0000 mg | ORAL_TABLET | Freq: Four times a day (QID) | ORAL | Status: DC | PRN
Start: 2016-01-31 — End: 2016-02-01

## 2016-01-31 MED ORDER — GUAIFENESIN-DM 100-10 MG/5ML PO SYRP
5.0000 mL | ORAL_SOLUTION | ORAL | Status: DC | PRN
  Administered 2016-01-31 – 2016-02-01 (×2): 5 mL via ORAL
  Filled 2016-01-31 (×2): qty 5

## 2016-01-31 MED ORDER — FLUTICASONE PROPIONATE 50 MCG/ACT NA SUSP
1.0000 | Freq: Two times a day (BID) | NASAL | Status: DC
  Administered 2016-02-01: 1 via NASAL
  Filled 2016-01-31: qty 16

## 2016-01-31 MED ORDER — ENOXAPARIN SODIUM 40 MG/0.4ML ~~LOC~~ SOLN
40.0000 mg | SUBCUTANEOUS | Status: DC
  Administered 2016-01-31: 40 mg via SUBCUTANEOUS
  Filled 2016-01-31: qty 0.4

## 2016-01-31 MED ORDER — MENTHOL 3 MG MT LOZG
1.0000 | LOZENGE | OROMUCOSAL | Status: DC | PRN
  Filled 2016-01-31: qty 9

## 2016-01-31 MED ORDER — ONDANSETRON HCL 4 MG/2ML IJ SOLN
INTRAMUSCULAR | Status: AC
  Administered 2016-01-31: 06:00:00
  Filled 2016-01-31: qty 2

## 2016-01-31 MED ORDER — ACETAMINOPHEN 650 MG RE SUPP
650.0000 mg | Freq: Four times a day (QID) | RECTAL | Status: DC | PRN
Start: 2016-01-31 — End: 2016-02-01

## 2016-01-31 MED ORDER — ONDANSETRON HCL 4 MG/2ML IJ SOLN
4.0000 mg | Freq: Four times a day (QID) | INTRAMUSCULAR | Status: DC | PRN
  Administered 2016-02-01: 4 mg via INTRAVENOUS
  Filled 2016-01-31: qty 2

## 2016-01-31 MED ORDER — SODIUM CHLORIDE 0.9% FLUSH
3.0000 mL | Freq: Two times a day (BID) | INTRAVENOUS | Status: DC
  Administered 2016-01-31 – 2016-02-01 (×3): 3 mL via INTRAVENOUS

## 2016-01-31 MED ORDER — LORATADINE 10 MG PO TABS
10.0000 mg | ORAL_TABLET | Freq: Every day | ORAL | Status: DC
  Administered 2016-01-31 – 2016-02-01 (×2): 10 mg via ORAL
  Filled 2016-01-31 (×2): qty 1

## 2016-01-31 MED ORDER — DOCUSATE SODIUM 100 MG PO CAPS
100.0000 mg | ORAL_CAPSULE | Freq: Two times a day (BID) | ORAL | Status: DC
  Administered 2016-01-31 – 2016-02-01 (×3): 100 mg via ORAL
  Filled 2016-01-31 (×3): qty 1

## 2016-01-31 MED ORDER — ACETAMINOPHEN 325 MG PO TABS
650.0000 mg | ORAL_TABLET | Freq: Four times a day (QID) | ORAL | Status: DC | PRN
Start: 2016-01-31 — End: 2016-02-01
  Administered 2016-01-31 (×2): 650 mg via ORAL
  Filled 2016-01-31 (×2): qty 2

## 2016-01-31 MED ORDER — NITROGLYCERIN 2 % TD OINT
1.0000 [in_us] | TOPICAL_OINTMENT | Freq: Once | TRANSDERMAL | Status: AC
  Administered 2016-01-31: 1 [in_us] via TOPICAL
  Filled 2016-01-31: qty 1

## 2016-01-31 MED ORDER — LISINOPRIL 10 MG PO TABS
10.0000 mg | ORAL_TABLET | Freq: Every day | ORAL | Status: DC
  Administered 2016-01-31 – 2016-02-01 (×2): 10 mg via ORAL
  Filled 2016-01-31 (×3): qty 1

## 2016-01-31 MED ORDER — FUROSEMIDE 10 MG/ML IJ SOLN
20.0000 mg | Freq: Two times a day (BID) | INTRAMUSCULAR | Status: DC
  Administered 2016-02-01: 20 mg via INTRAVENOUS
  Filled 2016-01-31: qty 2

## 2016-01-31 MED ORDER — INSULIN ASPART 100 UNIT/ML ~~LOC~~ SOLN: 0.0000 [IU] | Freq: Every day | SUBCUTANEOUS | Status: DC

## 2016-01-31 NOTE — ED Notes (Signed)
RT notified bring BIPAP per Dr. Zenda AlpersWebster

## 2016-01-31 NOTE — ED Provider Notes (Signed)
[facility] Emergency Department Provider Note   ____________________________________________   First MD Initiated Contact with Patient 01/31/16 0032     (approximate)  I have reviewed the triage vital signs and the nursing notes.   HISTORY  Chief Complaint Cough and Shortness of Breath    HPI Michele Freeman is a 51 y.o. female who comes into the hospital today with some shortness of breath. The patient reports that the symptoms started today. She states that she can't breathe but she went to the day just trying to make it. She reports it is been a dry cough that has sometimes had some clear and milky sputum. The patient denies any fever. She was here a few weeks ago and diagnosed with a virus. She states that her daughter was diagnosed with the flu. 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 The patient states that she doesn't typically get sick. She is a smoker but reports that she hasn't smoked in 2 days. She did vomit times once tonight but she reports it was after coughing excessively. The patient was taking Mucinex but stopped because of her blood pressure. The ambulance came out to her house and gave her a neb treatment but she didn't decide to come in with them. The patient is here today for evaluation.   Past Medical History:  Diagnosis Date  . Diabetes mellitus without complication [facility](HCC)     Patient Active Problem List   Diagnosis Date Noted  . CHF (congestive heart failure) (HCC) 01/31/2016    Past Surgical History:  Procedure Laterality Date  . BREAST BIOPSY Right 12/08/2014   path pending  . BREAST CYST ASPIRATION Right 12/08/2014    Prior to Admission medications   Medication Sig Start Date End Date Taking? Authorizing Provider  brompheniramine-pseudoephedrine-DM 30-2-10 MG/5ML syrup Take 10 mLs by mouth 4 (four) times daily as needed. 01/11/16  Yes Christiane HaJonathan D Cuthriell, PA-C  cetirizine  (ZYRTEC) 10 MG tablet Take 1 tablet (10 mg total) by mouth daily. 01/11/16  Yes Christiane HaJonathan D Cuthriell, PA-C  fluticasone (FLONASE) 50 MCG/ACT nasal spray Place 1 spray into both nostrils 2 (two) times daily. 01/11/16  Yes Christiane HaJonathan D Cuthriell, PA-C  metFORMIN (GLUCOPHAGE) 500 MG tablet Take 1 tablet by mouth 2 (two) times daily. 09/28/14 01/30/17 Yes Historical Provider, MD  ondansetron (ZOFRAN-ODT) 4 MG disintegrating tablet Take 1 tablet (4 mg total) by mouth every 8 (eight) hours as needed for nausea or vomiting. 01/11/16  Yes Christiane HaJonathan D Cuthriell, PA-C    Allergies No known allergies  Family History  Problem Relation Age of Onset  . Breast cancer Neg Hx     Social History Social History  Substance Use Topics  . Smoking status: Current Every Day Smoker    Packs/day: 0.50    Types: Cigarettes  . Smokeless tobacco: Never Used     Comment: gave information on free  Quit Smoking classes  . Alcohol use 1.2 oz/week    2 Standard drinks or equivalent per week    Review of Systems Constitutional: No fever/chills Eyes: No visual changes. ENT: No sore throat. Cardiovascular: Denies chest pain. Respiratory: Cough and shortness of breath. Gastrointestinal: Vomiting with No abdominal pain.  No nausea,  No diarrhea.  No constipation. Genitourinary: Negative for dysuria. Musculoskeletal: Negative for back pain. Skin: Negative for rash. Neurological: Negative for headaches, focal weakness or numbness.  10-point ROS otherwise negative.  ____________________________________________   PHYSICAL  EXAM:  VITAL SIGNS: ED Triage Vitals  Enc Vitals Group     BP 01/31/16 0030 (!) 177/119     Pulse Rate 01/31/16 0030 (!) 109     Resp 01/31/16 0030 18     Temp 01/31/16 0030 98.1 F (36.7 C)     Temp Source 01/31/16 0030 Oral     SpO2 01/31/16 0030 96 %     Weight 01/31/16 0026 202 lb 9.6 oz (91.9 kg)     Height 01/31/16 0026 5\' 8"  (1.727 m)     Head Circumference --      Peak Flow --       Pain Score 01/31/16 0027 0     Pain Loc --      Pain Edu? --      Excl. in GC? --     Constitutional: Alert and oriented. Well appearing and in Moderate respiratory distress. Eyes: Conjunctivae are normal. PERRL. EOMI. Head: Atraumatic. Nose: No congestion/rhinnorhea. Mouth/Throat: Mucous membranes are moist.  Oropharynx non-erythematous. Cardiovascular: Tachycardia, regular rhythm. Grossly normal heart sounds.  Good peripheral circulation. Respiratory: Normal respiratory effort.  No retractions. Crackles in bilateral bases. Gastrointestinal: Soft and nontender. No distention. Positive bowel sounds Musculoskeletal: No lower extremity tenderness nor edema.   Neurologic:  Normal speech and language.  Skin:  Skin is warm, dry and intact.  Psychiatric: Mood and affect are normal.   ____________________________________________   LABS (all labs ordered are listed, but only abnormal results are displayed)  Labs Reviewed  COMPREHENSIVE METABOLIC PANEL - Abnormal; Notable for the following:       Result Value   Glucose, Bld 170 (*)    All other components within normal limits  BRAIN NATRIURETIC PEPTIDE - Abnormal; Notable for the following:    B Natriuretic Peptide 582.0 (*)    All other components within normal limits  CBC WITH DIFFERENTIAL/PLATELET  TROPONIN I   ____________________________________________  EKG  ED ECG REPORT I, Rebecka Apley, the attending physician, personally viewed and interpreted this ECG.   Date: 01/31/2016  EKG Time: 115  Rate: 117  Rhythm: sinus tachycardia  Axis: normal  Intervals:none  ST&T Change: Flipped T waves in leads 23 aVF as well as V5 and V6.  ____________________________________________  RADIOLOGY  CXR ____________________________________________   PROCEDURES  Procedure(s) performed: None  Procedures  Critical Care performed: No  ____________________________________________   INITIAL IMPRESSION / ASSESSMENT AND  PLAN / ED COURSE  Pertinent labs & imaging results that were available during my care of the patient were reviewed by me and considered in my medical decision making (see chart for details).  This is a 51 year old female who comes into the hospital today with some shortness of breath. The patient is tachycardic and also has crackles on her exam. I did give her a DuoNeb treatment in the event that this was a COPD exacerbation. She reports that helped a little bit. I did perform a chest x-ray that when the patient does appear to have some interstitial edema bilaterally. I did later Nitropaste to the patient's chest as her blood pressure is elevated and I did give her dose of Lasix. I will admit the patient to the hospitalist service as this is a new onset of CHF. The patient's BNP was mildly elevated at 500. I initially didn't plan to place the patient on BiPAP but after the breathing treatment as well as the Lasix she reports that she was feeling improved. The patient be admitted to the hospitalist service.  Clinical Course as of Jan 31 239  Tue Jan 31, 2016  0106 Vascular congestion and mild cardiomegaly. Bibasilar airspace opacities raise concern for mild interstitial edema, though pneumonia could have a similar appearance.   DG Chest 1 View [AW]    Clinical Course User Index [AW] Rebecka Apley, MD     ____________________________________________   FINAL CLINICAL IMPRESSION(S) / ED DIAGNOSES  Final diagnoses:  Shortness of breath  Acute congestive heart failure, unspecified congestive heart failure type (HCC)      NEW MEDICATIONS STARTED DURING THIS VISIT:  New Prescriptions   No medications on file     Note:  This document was prepared using Dragon voice recognition software and may include unintentional dictation errors.    Rebecka Apley, MD 01/31/16 [phone]

## 2016-01-31 NOTE — ED Notes (Signed)
Pt. Witnessed Vomiting bile-like substance while sitting on side of the bed. This RN administered PRN zofran order.

## 2016-01-31 NOTE — Progress Notes (Addendum)
Vital signs stable. Lab revealed. Physical examination:  bilateral basilar rales. No leg edema.  A/P: This is a 51 year old female admitted for new-onset CHF secondary to uncontrolled hypertension. 1. CHF: Likely systolic given uncontrolled hypertension.  Continue Lasix iv bid, f/u Echocardiogram.  2. Hypertension: started the patient on lisinopril.  3. Diabetes mellitus type 2: Hold oral hypoglycemic agents. Check hemoglobin A1c. Sliding scale insulin  4. Tobacco abuse. Smoking cessation was counseled for 4 minutes.

## 2016-01-31 NOTE — ED Notes (Addendum)
Pt. From home by [facility] d/t SOB. Dx here with URI a couple weeks ago, sx no better. Recent Flu exposure. Congestion with productive cough-clear phlegm. Called ems at 7p last night, neb tx no transport. Called again d/t recurring sx. VS in route 178/88, 110-122 SR on monitor, 98.0 temp, 97%RA.

## 2016-01-31 NOTE — Progress Notes (Signed)
Pt alert and oriented x4, complaints of pain and discomfort ie headache.  Pt was medicated with tylenol and will reassess in one hr..  Bed in low position, call bell within reach.  Bed alarms on and functioning.  Assessment done and charted.  Will continue to monitor and do hourly rounding throughout the shift

## 2016-01-31 NOTE — H&P (Signed)
Michele Freeman is an 51 y.o. female.   Chief Complaint: Shortness of breath HPI: The patient with past medical history of diabetes presents emergency department complaining of shortness of breath. Her breathing has become more difficult over the last few days but the patient has had a cough for at least 2 weeks. She had been prescribed a cough syrup which she states is not helping. Her cough is only productive of a thin frothy sputum. She denies seeing blood-tinge or purulent material. She also denies chest pain, nausea, vomiting or diaphoresis. In the emergency department the patient was found to be hypertensive, tachycardic and tachypneic. Chest x-ray was consistent with mild interstitial edema. Nitropaste was placed on the patient's chest and she was given Lasix IV. The patient had good urine output and felt marginally better after diuretic therapy. Due to concern for new onset CHF emergency department staff called the hospitalist service for admission.  Past Medical History:  Diagnosis Date  . Diabetes mellitus without complication [facility])     Past Surgical History:  Procedure Laterality Date  . BREAST BIOPSY Right 12/08/2014   path pending  . BREAST CYST ASPIRATION Right 12/08/2014    Family History  Problem Relation Age of Onset  . CAD Father   . Breast cancer Neg Hx    Social History:  reports that she has been smoking Cigarettes.  She has been smoking about 0.50 packs per day. She has never used smokeless tobacco. She reports that she drinks about 1.2 oz of alcohol per week . Her drug history is not on file.  Allergies:  Allergies  Allergen Reactions  . No Known Allergies     Medications Prior to Admission  Medication Sig Dispense Refill  . brompheniramine-pseudoephedrine-DM 30-2-10 MG/5ML syrup Take 10 mLs by mouth 4 (four) times daily as needed. 200 mL 0  . cetirizine (ZYRTEC) 10 MG tablet Take 1 tablet (10 mg total) by mouth daily. 30 tablet 0  . fluticasone (FLONASE) 50  MCG/ACT nasal spray Place 1 spray into both nostrils 2 (two) times daily. 16 g 0  . metFORMIN (GLUCOPHAGE) 500 MG tablet Take 1 tablet by mouth 2 (two) times daily.    . ondansetron (ZOFRAN-ODT) 4 MG disintegrating tablet Take 1 tablet (4 mg total) by mouth every 8 (eight) hours as needed for nausea or vomiting. 20 tablet 0    Results for orders placed or performed during the hospital encounter of 01/31/16 (from the past 48 hour(s))  CBC with Differential     Status: None   Collection Time: 01/31/16 12:33 AM  Result Value Ref Range   WBC 9.0 3.6 - 11.0 K/uL   RBC 5.05 3.80 - 5.20 MIL/uL   Hemoglobin 15.5 12.0 - 16.0 g/dL   HCT 45.2 35.0 - 47.0 %   MCV 89.7 80.0 - 100.0 fL   MCH 30.7 26.0 - 34.0 pg   MCHC 34.2 32.0 - 36.0 g/dL   RDW 13.4 11.5 - 14.5 %   Platelets 376 150 - 440 K/uL   Neutrophils Relative % 55 %   Neutro Abs 5.0 1.4 - 6.5 K/uL   Lymphocytes Relative 37 %   Lymphs Abs 3.4 1.0 - 3.6 K/uL   Monocytes Relative 7 %   Monocytes Absolute 0.6 0.2 - 0.9 K/uL   Eosinophils Relative 0 %   Eosinophils Absolute 0.0 0 - 0.7 K/uL   Basophils Relative 1 %   Basophils Absolute 0.0 0 - 0.1 K/uL  Comprehensive metabolic panel  Status: Abnormal   Collection Time: 01/31/16 12:33 AM  Result Value Ref Range   Sodium 140 135 - 145 mmol/L   Potassium 3.7 3.5 - 5.1 mmol/L   Chloride 109 101 - 111 mmol/L   CO2 25 22 - 32 mmol/L   Glucose, Bld 170 (H) 65 - 99 mg/dL   BUN 17 6 - 20 mg/dL   Creatinine, Ser 0.80 0.44 - 1.00 mg/dL   Calcium 8.9 8.9 - 10.3 mg/dL   Total Protein 7.1 6.5 - 8.1 g/dL   Albumin 4.0 3.5 - 5.0 g/dL   AST 22 15 - 41 U/L   ALT 28 14 - 54 U/L   Alkaline Phosphatase 85 38 - 126 U/L   Total Bilirubin 0.7 0.3 - 1.2 mg/dL   GFR calc non Af Amer >60 >60 mL/min   GFR calc Af Amer >60 >60 mL/min    Comment: (NOTE) The eGFR has been calculated using the CKD EPI equation. This calculation has not been validated in all clinical situations. eGFR's persistently <60  mL/min signify possible Chronic Kidney Disease.    Anion gap 6 5 - 15  Troponin I     Status: None   Collection Time: 01/31/16 12:33 AM  Result Value Ref Range   Troponin I <0.03 <0.03 ng/mL  Brain natriuretic peptide     Status: Abnormal   Collection Time: 01/31/16 12:34 AM  Result Value Ref Range   B Natriuretic Peptide 582.0 (H) 0.0 - 100.0 pg/mL   Dg Chest 1 View  Result Date: 01/31/2016 CLINICAL DATA:  Acute onset of shortness of breath and congestion. Productive cough. Initial encounter. EXAM: CHEST 1 VIEW COMPARISON:  None. FINDINGS: The lungs are well-aerated. Vascular congestion is noted. Bibasilar airspace opacities raise concern for mild interstitial edema, though pneumonia could have a similar appearance. There is no evidence of pleural effusion or pneumothorax. The cardiomediastinal silhouette is mildly enlarged. No acute osseous abnormalities are seen. IMPRESSION: Vascular congestion and mild cardiomegaly. Bibasilar airspace opacities raise concern for mild interstitial edema, though pneumonia could have a similar appearance. Electronically Signed   By: Garald Balding M.D.   On: 01/31/2016 00:55    Review of Systems  Constitutional: Negative for chills and fever.  HENT: Negative for sore throat and tinnitus.   Eyes: Negative for blurred vision and redness.  Respiratory: Positive for cough and shortness of breath.   Cardiovascular: Negative for chest pain, palpitations, orthopnea and PND.  Gastrointestinal: Negative for abdominal pain, diarrhea, nausea and vomiting.  Genitourinary: Negative for dysuria, frequency and urgency.  Musculoskeletal: Negative for joint pain and myalgias.  Skin: Negative for rash.       No lesions  Neurological: Negative for speech change, focal weakness and weakness.  Endo/Heme/Allergies: Does not bruise/bleed easily.       No temperature intolerance  Psychiatric/Behavioral: Negative for depression and suicidal ideas.    Blood pressure (!)  152/96, pulse (!) 103, temperature 98.1 F (36.7 C), temperature source Oral, resp. rate (!) 30, height 5' 8"  (1.727 m), weight 91.9 kg (202 lb 9.6 oz), SpO2 99 %. Physical Exam  Vitals reviewed. Constitutional: She is oriented to person, place, and time. She appears well-developed and well-nourished. No distress.  HENT:  Head: Normocephalic and atraumatic.  Mouth/Throat: Oropharynx is clear and moist.  Eyes: Conjunctivae and EOM are normal. Pupils are equal, round, and reactive to light. No scleral icterus.  Neck: Normal range of motion. Neck supple. No JVD present. No tracheal deviation present. No thyromegaly  present.  Cardiovascular: Regular rhythm and normal heart sounds.  Tachycardia present.  Exam reveals no gallop and no friction rub.   No murmur heard. Respiratory: Effort normal. Tachypnea noted. She has rales (Faint bilaterally).  GI: Soft. Bowel sounds are normal. She exhibits no distension. There is no tenderness.  Genitourinary:  Genitourinary Comments: Deferred  Musculoskeletal: Normal range of motion. She exhibits no edema.  Lymphadenopathy:    She has no cervical adenopathy.  Neurological: She is alert and oriented to person, place, and time. No cranial nerve deficit. She exhibits normal muscle tone.  Skin: Skin is warm and dry. No rash noted. No erythema.  Psychiatric: She has a normal mood and affect. Her behavior is normal. Judgment and thought content normal.     Assessment/Plan This is a 51 year old female admitted for new-onset CHF secondary to uncontrolled hypertension. 1. CHF: Likely systolic given uncontrolled hypertension. She is not hypoxic. The patient is responding well to Lasix therapy and we will continue diuresis to improve respiratory rate. Once the patient is euvolemic she will likely need beta blocker therapy for rate control to achieve optimal cardiac output. Echocardiogram ordered 2. Hypertension: Uncontrolled; I have started the patient on lisinopril  to decrease cardiac remodeling. Titrate antihypertensive therapy as needed 3. Diabetes mellitus type 2: Hold oral hypoglycemic agents. Check hemoglobin A1c. Sliding scale insulin while hospitalized.  4. DVT prophylaxis: Lovenox 5. GI prophylaxis: None The patient is a full code. Time spent on admission orders and patient care possibly 45 minutes  Harrie Foreman, MD 01/31/2016, 8:10 AM

## 2016-02-01 LAB — BASIC METABOLIC PANEL
ANION GAP: 9 (ref 5–15)
BUN: 13 mg/dL (ref 6–20)
CHLORIDE: 106 mmol/L (ref 101–111)
CO2: 24 mmol/L (ref 22–32)
Calcium: 9 mg/dL (ref 8.9–10.3)
Creatinine, Ser: 0.62 mg/dL (ref 0.44–1.00)
GFR calc non Af Amer: >60 mL/min (ref >60)
Glucose, Bld: 142 mg/dL — ABNORMAL HIGH (ref 65–99)
POTASSIUM: 3.4 mmol/L — AB (ref 3.5–5.1)
SODIUM: 139 mmol/L (ref 135–145)

## 2016-02-01 LAB — ECHOCARDIOGRAM COMPLETE
Height: 68 in
Weight: 3241.6 oz

## 2016-02-01 LAB — HEMOGLOBIN A1C
Hgb A1c MFr Bld: 6.9 % — ABNORMAL HIGH (ref 4.8–5.6)
Mean Plasma Glucose: 151 mg/dL

## 2016-02-01 LAB — GLUCOSE, CAPILLARY: Glucose-Capillary: 138 mg/dL — ABNORMAL HIGH (ref 65–99)

## 2016-02-01 MED ORDER — FUROSEMIDE 40 MG PO TABS: 40.0000 mg | ORAL_TABLET | Freq: Two times a day (BID) | ORAL | 2 refills | Status: DC

## 2016-02-01 MED ORDER — LISINOPRIL 10 MG PO TABS: 10.0000 mg | ORAL_TABLET | Freq: Every day | ORAL | 2 refills | Status: AC

## 2016-02-01 MED ORDER — AMLODIPINE BESYLATE 10 MG PO TABS: 10.0000 mg | ORAL_TABLET | Freq: Every day | ORAL | 2 refills | Status: AC

## 2016-02-01 MED ORDER — HYDROCORTISONE 0.5 % EX CREA
TOPICAL_CREAM | Freq: Four times a day (QID) | CUTANEOUS | Status: DC
  Administered 2016-02-01: 08:00:00 via TOPICAL
  Filled 2016-02-01: qty 28.35

## 2016-02-01 MED ORDER — AMLODIPINE BESYLATE 10 MG PO TABS
10.0000 mg | ORAL_TABLET | Freq: Every day | ORAL | Status: DC
  Administered 2016-02-01: 10 mg via ORAL
  Filled 2016-02-01: qty 1

## 2016-02-01 NOTE — Progress Notes (Addendum)
[employer] [facility] [place] [facility]        Michele Freeman was admitted to the Hospital on 01/31/2016 and Discharged  02/01/2016 and should be excused from work/school   for 7 days starting 01/31/2016 , may return to work/school without any restrictions.  [name], [name] M.D on 02/01/2016,at 11:53 AM  [employer] [facility] [place] [facility]    Office  [phone]

## 2016-02-01 NOTE — Progress Notes (Signed)
Patient discharged per MD order and hospital protocol. Patient given prescriptions. Patient was also given the flu shot. Patient received her CHF packet. Patient escorted downstairs via wheelchair by staff.

## 2016-02-01 NOTE — Care Management (Signed)
Patient acknowledges diagnosis of CHF is new.  Independent in all adls, denies issues accessing medical care, obtaining medications or with transportation.  Current with her PCP.  She has referral to heart failure clinic. she has scales and CHF education material.  No discharge needs identified at present by care manager or members of care team

## 2016-02-01 NOTE — Progress Notes (Signed)
Initial HF Clinic appointment scheduled on February 13, 2016 at 9:00am. Thank you.

## 2016-02-01 NOTE — Discharge Instructions (Signed)
Heart Failure Clinic appointment on February 13, 2016 at 9:00am with Clarisa Kindredina Mikala Podoll, FNP. Please call [phone] to reschedule.    Heart healthy and ADA diet. Smoking cessation.

## 2016-02-01 NOTE — Discharge Summary (Addendum)
[facility] [place] [facility]   PATIENT NAME: Michele Freeman    MR#:  [id number]  DATE OF BIRTH:  1965/12/04  DATE OF ADMISSION:  01/31/2016   ADMITTING PHYSICIAN: Arnaldo NatalMichael S Diamond, MD  DATE OF DISCHARGE: 02/01/2016  1:29 PM  PRIMARY CARE PHYSICIAN: MCLAUGHLIN, MIRIAM K, MD   ADMISSION DIAGNOSIS:  Shortness of breath [R06.02] SOB (shortness of breath) [R06.02] Acute congestive heart failure, unspecified congestive heart failure type (HCC) [I50.9] DISCHARGE DIAGNOSIS:  Active Problems:   CHF (congestive heart failure) (HCC)  SECONDARY DIAGNOSIS:   Past Medical History:  Diagnosis Date  . Diabetes mellitus without complication [facility](HCC)    HOSPITAL COURSE:   A/P: This is a 51 year old female admitted for new-onset CHF secondary to uncontrolled hypertension. 1. Acute systolic CHF:   Continue Lasix iv bid, LV EF: 20% -   25% per Echocardiogram. Continue lisinopril. Follow-up cardiology and CHF clinic as outpatient.  2. Hypertension: started the patient on lisinopril and norvasc.  3. Diabetes mellitus type 2: Hold oral hypoglycemic agents.  hemoglobin A1c 6.9. Sliding scale insulin  4. Tobacco abuse. Smoking cessation was counseled for 4 minutes.  DISCHARGE CONDITIONS:  Stable, discharge to home today. CONSULTS OBTAINED:   DRUG ALLERGIES:   Allergies  Allergen Reactions  . No Known Allergies    DISCHARGE MEDICATIONS:   Allergies as of 02/01/2016      Reactions   No Known Allergies       Medication List    TAKE these medications   amLODipine 10 MG tablet Commonly known as:  NORVASC Take 1 tablet (10 mg total) by mouth daily.   brompheniramine-pseudoephedrine-DM 30-2-10 MG/5ML syrup Take 10 mLs by mouth 4 (four) times daily as needed.   cetirizine 10 MG tablet Commonly known as:  ZYRTEC Take 1 tablet (10 mg total) by mouth daily.   fluticasone 50 MCG/ACT nasal spray Commonly known as:  FLONASE Place 1 spray into both nostrils  2 (two) times daily.   furosemide 40 MG tablet Commonly known as:  LASIX Take 1 tablet (40 mg total) by mouth 2 (two) times daily.   lisinopril 10 MG tablet Commonly known as:  PRINIVIL,ZESTRIL Take 1 tablet (10 mg total) by mouth daily. Start taking on:  02/02/2016   metFORMIN 500 MG tablet Commonly known as:  GLUCOPHAGE Take 1 tablet by mouth 2 (two) times daily.   ondansetron 4 MG disintegrating tablet Commonly known as:  ZOFRAN-ODT Take 1 tablet (4 mg total) by mouth every 8 (eight) hours as needed for nausea or vomiting.        DISCHARGE INSTRUCTIONS:  See AVS.  If you experience worsening of your admission symptoms, develop shortness of breath, life threatening emergency, suicidal or homicidal thoughts you must seek medical attention immediately by calling 911 or calling your MD immediately  if symptoms less severe.  You Must read complete instructions/literature along with all the possible adverse reactions/side effects for all the Medicines you take and that have been prescribed to you. Take any new Medicines after you have completely understood and accpet all the possible adverse reactions/side effects.   Please note  You were cared for by a hospitalist during your hospital stay. If you have any questions about your discharge medications or the care you received while you were in the hospital after you are discharged, you can call the unit and asked to speak with the hospitalist on call if the hospitalist that took care of you is not available. Once  you are discharged, your primary care physician will handle any further medical issues. Please note that NO REFILLS for any discharge medications will be authorized once you are discharged, as it is imperative that you return to your primary care physician (or establish a relationship with a primary care physician if you do not have one) for your aftercare needs so that they can reassess your need for medications and monitor your  lab values.    On the day of Discharge:  VITAL SIGNS:  Blood pressure (!) 155/101, pulse 97, temperature 97.8 F (36.6 C), temperature source Oral, resp. rate 18, height 5\' 8"  (1.727 m), weight 201 lb 8 oz (91.4 kg), SpO2 100 %. PHYSICAL EXAMINATION:  GENERAL:  51 y.o.-year-old patient lying in the bed with no acute distress. Obese. EYES: Pupils equal, round, reactive to light and accommodation. No scleral icterus. Extraocular muscles intact.  HEENT: Head atraumatic, normocephalic. Oropharynx and nasopharynx clear.  NECK:  Supple, no jugular venous distention. No thyroid enlargement, no tenderness.  LUNGS: Normal breath sounds bilaterally, no wheezing, rales,rhonchi or crepitation. No use of accessory muscles of respiration.  CARDIOVASCULAR: S1, S2 normal. No murmurs, rubs, or gallops.  ABDOMEN: Soft, non-tender, non-distended. Bowel sounds present. No organomegaly or mass.  EXTREMITIES: No pedal edema, cyanosis, or clubbing.  NEUROLOGIC: Cranial nerves II through XII are intact. Muscle strength 5/5 in all extremities. Sensation intact. Gait not checked.  PSYCHIATRIC: The patient is alert and oriented x 3.  SKIN: No obvious rash, lesion, or ulcer.  DATA REVIEW:   CBC  Recent Labs Lab 01/31/16 0033  WBC 9.0  HGB 15.5  HCT 45.2  PLT 376    Chemistries   Recent Labs Lab 01/31/16 0033 02/01/16 0516  NA 140 139  K 3.7 3.4*  CL 109 106  CO2 25 24  GLUCOSE 170* 142*  BUN 17 13  CREATININE 0.80 0.62  CALCIUM 8.9 9.0  AST 22  --   ALT 28  --   ALKPHOS 85  --   BILITOT 0.7  --      Microbiology Results  Results for orders placed or performed in visit on 07/20/13  Wet prep, genital     Status: None   Collection Time: 07/20/13  5:23 AM  Result Value Ref Range Status   Micro Text Report   Final       SOURCE: VAGINAL SWAB    COMMENT                   NO WHITE BLOOD CELLS SEEN   COMMENT                   NO TRICHOMONAS,SPERMATOZOA,YEAST,OR CLUE CELLS SEEN     ANTIBIOTIC                                                      GC/Chlamydia Probe Amp     Status: None   Collection Time: 07/20/13  5:23 AM  Result Value Ref Range Status   Micro Text Report   Final       CHLAMYDIA                 CHLAMYDIA TRACHOMATIS NEGATIVE   N.GONORRHOEAE             N.GONORRHOEAE NEGATIVE   ANTIBIOTIC  RADIOLOGY:  No results found.   Management plans discussed with the patient, family and they are in agreement.  CODE STATUS:     Code Status Orders        Start     Ordered   01/31/16 0804  Full code  Continuous     01/31/16 0803    Code Status History    Date Active Date Inactive Code Status Order ID Comments User Context   This patient has a current code status but no historical code status.      TOTAL TIME TAKING CARE OF THIS PATIENT: 33 minutes.    Shaune Pollack M.D on 02/01/2016 at 4:21 PM  Between 7am to 6pm - Pager - [phone]  After 6pm go to [email or web address] - Social research officer, government  [facility]  Office  [phone]  CC: Primary care physician; Patrice Paradise, MD   Note: This dictation was prepared with Dragon dictation along with smaller phrase technology. Any transcriptional errors that result from this process are unintentional.

## 2016-02-13 ENCOUNTER — Encounter: Payer: Self-pay | Admitting: Family

## 2016-02-13 ENCOUNTER — Ambulatory Visit: Payer: PRIVATE HEALTH INSURANCE | Attending: Family | Admitting: Family

## 2016-02-13 VITALS — BP 114/73 | HR 96 | Resp 18 | Ht 67.0 in | Wt 196.0 lb

## 2016-02-13 DIAGNOSIS — Z720 Tobacco use: Secondary | ICD-10-CM | POA: Insufficient documentation

## 2016-02-13 DIAGNOSIS — Z8349 Family history of other endocrine, nutritional and metabolic diseases: Secondary | ICD-10-CM | POA: Diagnosis not present

## 2016-02-13 DIAGNOSIS — Z833 Family history of diabetes mellitus: Secondary | ICD-10-CM | POA: Diagnosis not present

## 2016-02-13 DIAGNOSIS — Z7984 Long term (current) use of oral hypoglycemic drugs: Secondary | ICD-10-CM | POA: Insufficient documentation

## 2016-02-13 DIAGNOSIS — I509 Heart failure, unspecified: Secondary | ICD-10-CM | POA: Diagnosis present

## 2016-02-13 DIAGNOSIS — I10 Essential (primary) hypertension: Secondary | ICD-10-CM

## 2016-02-13 DIAGNOSIS — I5022 Chronic systolic (congestive) heart failure: Secondary | ICD-10-CM

## 2016-02-13 DIAGNOSIS — F17210 Nicotine dependence, cigarettes, uncomplicated: Secondary | ICD-10-CM | POA: Insufficient documentation

## 2016-02-13 DIAGNOSIS — E785 Hyperlipidemia, unspecified: Secondary | ICD-10-CM | POA: Diagnosis not present

## 2016-02-13 DIAGNOSIS — E119 Type 2 diabetes mellitus without complications: Secondary | ICD-10-CM

## 2016-02-13 DIAGNOSIS — G479 Sleep disorder, unspecified: Secondary | ICD-10-CM | POA: Insufficient documentation

## 2016-02-13 DIAGNOSIS — I110 Hypertensive heart disease with heart failure: Secondary | ICD-10-CM | POA: Diagnosis not present

## 2016-02-13 DIAGNOSIS — Z79899 Other long term (current) drug therapy: Secondary | ICD-10-CM | POA: Diagnosis not present

## 2016-02-13 DIAGNOSIS — Z8249 Family history of ischemic heart disease and other diseases of the circulatory system: Secondary | ICD-10-CM | POA: Insufficient documentation

## 2016-02-13 DIAGNOSIS — Z7951 Long term (current) use of inhaled steroids: Secondary | ICD-10-CM | POA: Insufficient documentation

## 2016-02-13 NOTE — Progress Notes (Addendum)
Patient ID: Michele Freeman, female    DOB: 1965/01/28, 51 y.o.   MRN: [id number]  HPI  Michele Freeman is a 51 y/o female with a history of DM, HTN, hyperlipidemia, recent tobacco use and chronic heart failure.   Last echo was done 01/31/16 and showed an EF of 20-25% along with moderate MR and normal pulmonary systolic pressure.   Admitted on 01/31/16 with new onset HF secondary to uncontrolled HTN. Was initially treated with IV diuretics and transitioned to oral diuretics. She was discharged the following day with cardiology follow-up. Was in the ED on 01/11/16 with a viral illness and she was discharged home from the ED.   She presents today for her initial visit with minimal fatigue and shortness of breath upon exertion. Symptoms are quickly relieved upon rest. Denies any swelling in her legs/abdomen. Not weighing daily as she doesn't have any scales.   Past Medical History:  Diagnosis Date  . CHF (congestive heart failure) (HCC)   . Diabetes mellitus without complication (HCC)   . Hyperlipidemia   . Hypertension    Past Surgical History:  Procedure Laterality Date  . ABDOMINAL HYSTERECTOMY    . BREAST BIOPSY Right 12/08/2014   path pending  . BREAST CYST ASPIRATION Right 12/08/2014   Family History  Problem Relation Age of Onset  . CAD Father   . Diabetes Mother   . Hypertension Mother   . Thyroid disease Mother   . Heart failure Maternal Aunt   . Heart failure Maternal Uncle   . CAD Maternal Grandfather   . Heart failure Maternal Aunt   . Breast cancer Neg Hx    Social History  Substance Use Topics  . Smoking status: Current Every Day Smoker    Packs/day: 0.50    Types: Cigarettes  . Smokeless tobacco: Never Used     Comment: gave information on free  Quit Smoking classes  . Alcohol use 1.2 oz/week    2 Standard drinks or equivalent per week     Comment: occassional   Allergies  Allergen Reactions  . No Known Allergies    Prior to Admission medications    Medication Sig Start Date End Date Taking? Authorizing Provider  amLODipine (NORVASC) 10 MG tablet Take 1 tablet (10 mg total) by mouth daily. 02/01/16  Yes Shaune PollackQing Chen, MD  cetirizine (ZYRTEC) 10 MG tablet Take 1 tablet (10 mg total) by mouth daily. 01/11/16  Yes Christiane HaJonathan D Cuthriell, PA-C  fluticasone (FLONASE) 50 MCG/ACT nasal spray Place 1 spray into both nostrils 2 (two) times daily. 01/11/16  Yes Christiane HaJonathan D Cuthriell, PA-C  furosemide (LASIX) 40 MG tablet Take 1 tablet (40 mg total) by mouth 2 (two) times daily. 02/01/16  Yes Shaune PollackQing Chen, MD  lisinopril (PRINIVIL,ZESTRIL) 10 MG tablet Take 1 tablet (10 mg total) by mouth daily. 02/02/16  Yes Shaune PollackQing Chen, MD  metFORMIN (GLUCOPHAGE) 500 MG tablet Take 1 tablet by mouth 2 (two) times daily. 09/28/14 01/30/17 Yes Historical Provider, MD     Review of Systems  Constitutional: Positive for fatigue. Negative for appetite change.  HENT: Negative for congestion, postnasal drip and sore throat.   Eyes: Negative.   Respiratory: Positive for shortness of breath. Negative for cough and chest tightness.   Cardiovascular: Negative for chest pain, palpitations and leg swelling.  Gastrointestinal: Negative for abdominal distention and abdominal pain.  Endocrine: Negative.   Genitourinary: Negative.   Musculoskeletal: Negative for back pain and neck pain.  Skin: Negative.   Allergic/Immunologic:  Negative.   Neurological: Negative for dizziness and light-headedness.  Hematological: Negative for adenopathy. Does not bruise/bleed easily.  Psychiatric/Behavioral: Positive for sleep disturbance (trouble staying asleep; wakes up at 3:30am ). Negative for dysphoric mood. The patient is not nervous/anxious.    Vitals:   02/13/16 0915  BP: 114/73  Pulse: 96  Resp: 18  Weight: 196 lb (88.9 kg)  Height: 5\' 7"  (1.702 m)   Wt Readings from Last 3 Encounters:  02/13/16 196 lb (88.9 kg)  02/01/16 201 lb 8 oz (91.4 kg)  01/11/16 200 lb (90.7 kg)   Lab Results   Component Value Date   CREATININE 0.62 02/01/2016   CREATININE 0.80 01/31/2016   CREATININE 0.81 08/12/2013   Physical Exam  Constitutional: She is oriented to person, place, and time. She appears well-developed and well-nourished.  HENT:  Head: Normocephalic and atraumatic.  Eyes: Conjunctivae are normal. Pupils are equal, round, and reactive to light.  Neck: Normal range of motion. Neck supple. No JVD present.  Cardiovascular: Normal rate and regular rhythm.   Pulmonary/Chest: Effort normal. She has no wheezes. She has no rales.  Abdominal: Soft. She exhibits no distension. There is no tenderness.  Musculoskeletal: She exhibits no edema or tenderness.  Neurological: She is alert and oriented to person, place, and time.  Skin: Skin is warm and dry.  Psychiatric: She has a normal mood and affect. Her behavior is normal. Thought content normal.  Nursing note and vitals reviewed.   Assessment & Plan:  1: Chronic heart failure with reduced ejection fraction- - NYHA class II - euvolemic - not weighing daily. Set of scales given to her since she didn't have any and she was instructed to call for an overnight weight gain of >2 pounds or a weekly weight gain of >5 pounds - using Mrs. Dash seasoning and is reading food labels to keep her daily sodium consumption to 2000mg  daily - says that she's drinking about 80 ounces of fluid daily and she was encouraged to decrease her fluid intake down to 50-60 ounces daily. Can use sugar free candy if feels thirsty - discussed changing lisinopril to entresto in the future as well as changing her amlodipine to carvedilol - received her flu vaccine for this season already - does not have cardiology appointment set up and she prefers Dr. Lady Gary so we will get that scheduled for her - decrease furosemide to once daily. Can take the 2nd dose for above weight parameters, swelling or shortness of breath.  - exercising about 30 minutes three times/week -  Pharm D went in and reviewed medications with patient  2: HTN - BP looks good  - sees PCP Merlinda Frederick) 02/16/16  3: Diabetes-  - glucose has been running around 114  4: Tobacco use- - was smoking 1/2 ppd of cigarettes and now smokes about 1 cigarette weekly - complete cessation discussed for 3 minutes with her  5: Sleep disturbance- - waking up early morning every morning around 3:30am and she's unsure why. Wakes up feeling tired. Unsure if she snores - will get a sleep study done to rule out sleep apnea  Medication bottles were reviewed.  Return in 1 month or sooner for any questions/problems before then.

## 2016-02-13 NOTE — Addendum Note (Signed)
Addended by: Clarisa KindredHACKNEY, TINA A on: 02/13/2016 03:02 PM   Modules accepted: Orders

## 2016-02-13 NOTE — Patient Instructions (Addendum)
Decrease fluid pill to once daily.   Begin weighing daily and call for an overnight weight gain of > 2 pounds or a weekly weight gain of >5 pounds.  Smoking Cessation Quitting smoking is important to your health and has many advantages. However, it is not always easy to quit since nicotine is a very addictive drug. Oftentimes, people try 3 times or more before being able to quit. This document explains the best ways for you to prepare to quit smoking. Quitting takes hard work and a lot of effort, but you can do it. ADVANTAGES OF QUITTING SMOKING  You will live longer, feel better, and live better.  Your body will feel the impact of quitting smoking almost immediately.  Within 20 minutes, blood pressure decreases. Your pulse returns to its normal level.  After 8 hours, carbon monoxide levels in the blood return to normal. Your oxygen level increases.  After 24 hours, the chance of having a heart attack starts to decrease. Your breath, hair, and body stop smelling like smoke.  After 48 hours, damaged nerve endings begin to recover. Your sense of taste and smell improve.  After 72 hours, the body is virtually free of nicotine. Your bronchial tubes relax and breathing becomes easier.  After 2 to 12 weeks, lungs can hold more air. Exercise becomes easier and circulation improves.  The risk of having a heart attack, stroke, cancer, or lung disease is greatly reduced.  After 1 year, the risk of coronary heart disease is cut in half.  After 5 years, the risk of stroke falls to the same as a nonsmoker.  After 10 years, the risk of lung cancer is cut in half and the risk of other cancers decreases significantly.  After 15 years, the risk of coronary heart disease drops, usually to the level of a nonsmoker.  If you are pregnant, quitting smoking will improve your chances of having a healthy baby.  The people you live with, especially any children, will be healthier.  You will have extra  money to spend on things other than cigarettes. QUESTIONS TO THINK ABOUT BEFORE ATTEMPTING TO QUIT You may want to talk about your answers with your health care provider.  Why do you want to quit?  If you tried to quit in the past, what helped and what did not?  What will be the most difficult situations for you after you quit? How will you plan to handle them?  Who can help you through the tough times? Your family? Friends? A health care provider?  What pleasures do you get from smoking? What ways can you still get pleasure if you quit? Here are some questions to ask your health care provider:  How can you help me to be successful at quitting?  What medicine do you think would be best for me and how should I take it?  What should I do if I need more help?  What is smoking withdrawal like? How can I get information on withdrawal? GET READY  Set a quit date.  Change your environment by getting rid of all cigarettes, ashtrays, matches, and lighters in your home, car, or work. Do not let people smoke in your home.  Review your past attempts to quit. Think about what worked and what did not. GET SUPPORT AND ENCOURAGEMENT You have a better chance of being successful if you have help. You can get support in many ways.  Tell your family, friends, and coworkers that you are going to  quit and need their support. Ask them not to smoke around you.  Get individual, group, or telephone counseling and support. Programs are available at [employer] and health centers. Call your local health department for information about programs in your area.  Spiritual beliefs and practices may help some smokers quit.  Download a "quit meter" on your computer to keep track of quit statistics, such as how long you have gone without smoking, cigarettes not smoked, and money saved.  Get a self-help book about quitting smoking and staying off tobacco. LEARN NEW SKILLS AND BEHAVIORS  Distract yourself  from urges to smoke. Talk to someone, go for a walk, or occupy your time with a task.  Change your normal routine. Take a different route to work. Drink tea instead of coffee. Eat breakfast in a different place.  Reduce your stress. Take a hot bath, exercise, or read a book.  Plan something enjoyable to do every day. Reward yourself for not smoking.  Explore interactive web-based programs that specialize in helping you quit. GET MEDICINE AND USE IT CORRECTLY Medicines can help you stop smoking and decrease the urge to smoke. Combining medicine with the above behavioral methods and support can greatly increase your chances of successfully quitting smoking.  Nicotine replacement therapy helps deliver nicotine to your body without the negative effects and risks of smoking. Nicotine replacement therapy includes nicotine gum, lozenges, inhalers, nasal sprays, and skin patches. Some may be available over-the-counter and others require a prescription.  Antidepressant medicine helps people abstain from smoking, but how this works is unknown. This medicine is available by prescription.  Nicotinic receptor partial agonist medicine simulates the effect of nicotine in your brain. This medicine is available by prescription. Ask your health care provider for advice about which medicines to use and how to use them based on your health history. Your health care provider will tell you what side effects to look out for if you choose to be on a medicine or therapy. Carefully read the information on the package. Do not use any other product containing nicotine while using a nicotine replacement product.  RELAPSE OR DIFFICULT SITUATIONS Most relapses occur within the first 3 months after quitting. Do not be discouraged if you start smoking again. Remember, most people try several times before finally quitting. You may have symptoms of withdrawal because your body is used to nicotine. You may crave cigarettes, be  irritable, feel very hungry, cough often, get headaches, or have difficulty concentrating. The withdrawal symptoms are only temporary. They are strongest when you first quit, but they will go away within 10-14 days. To reduce the chances of relapse, try to:  Avoid drinking alcohol. Drinking lowers your chances of successfully quitting.  Reduce the amount of caffeine you consume. Once you quit smoking, the amount of caffeine in your body increases and can give you symptoms, such as a rapid heartbeat, sweating, and anxiety.  Avoid smokers because they can make you want to smoke.  Do not let weight gain distract you. Many smokers will gain weight when they quit, usually less than 10 pounds. Eat a healthy diet and stay active. You can always lose the weight gained after you quit.  Find ways to improve your mood other than smoking. FOR MORE INFORMATION  www.smokefree.gov  Document Released: 12/12/2000 Document Revised: 05/04/2013 Document Reviewed: 03/29/2011 [employer] Patient Information 2015 [place], [place]. This information is not intended to replace advice given to you by your health care provider. Make sure you discuss  any questions you have with your health care provider.  

## 2016-03-05 ENCOUNTER — Other Ambulatory Visit: Payer: Self-pay | Admitting: Physician Assistant

## 2016-03-05 DIAGNOSIS — N6310 Unspecified lump in the right breast, unspecified quadrant: Secondary | ICD-10-CM

## 2016-03-12 ENCOUNTER — Telehealth: Payer: Self-pay | Admitting: Family

## 2016-03-12 ENCOUNTER — Ambulatory Visit: Payer: PRIVATE HEALTH INSURANCE | Admitting: Family

## 2016-03-12 NOTE — Telephone Encounter (Signed)
Patient did not show for her [facility] appointment on 03/12/16. Will attempt to reschedule.

## 2016-03-19 ENCOUNTER — Ambulatory Visit
Admission: RE | Admit: 2016-03-19 | Discharge: 2016-03-19 | Disposition: A | Discharge status: Home / Self Care | Payer: No Typology Code available for payment source | Source: Ambulatory Visit | Attending: Physician Assistant | Admitting: Physician Assistant

## 2016-03-19 DIAGNOSIS — N6001 Solitary cyst of right breast: Secondary | ICD-10-CM | POA: Diagnosis not present

## 2016-03-19 DIAGNOSIS — N6310 Unspecified lump in the right breast, unspecified quadrant: Secondary | ICD-10-CM

## 2016-03-21 ENCOUNTER — Ambulatory Visit: Payer: PRIVATE HEALTH INSURANCE | Attending: Specialist

## 2017-01-02 IMAGING — MG MM DIAG BREAST TOMO BILATERAL
8 of 15 series · 8 of 35 positions shown · non-contrast
Comparison: Previous exam(s).

CLINICAL DATA: Patient presents today with a palpable mass within
the right breast, recently painful although the pain has decreased
in the last few days.

EXAM:
DIGITAL DIAGNOSTIC BILATERAL MAMMOGRAM WITH 3D TOMOSYNTHESIS WITH
CAD
ULTRASOUND RIGHT BREAST

[R CC synth-2D]
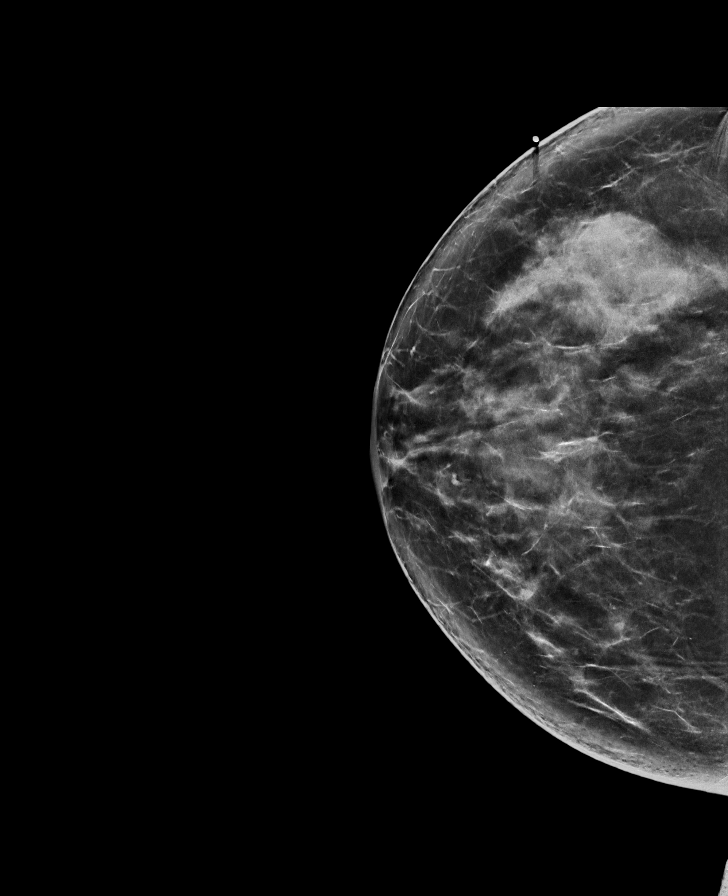

[L CC synth-2D]
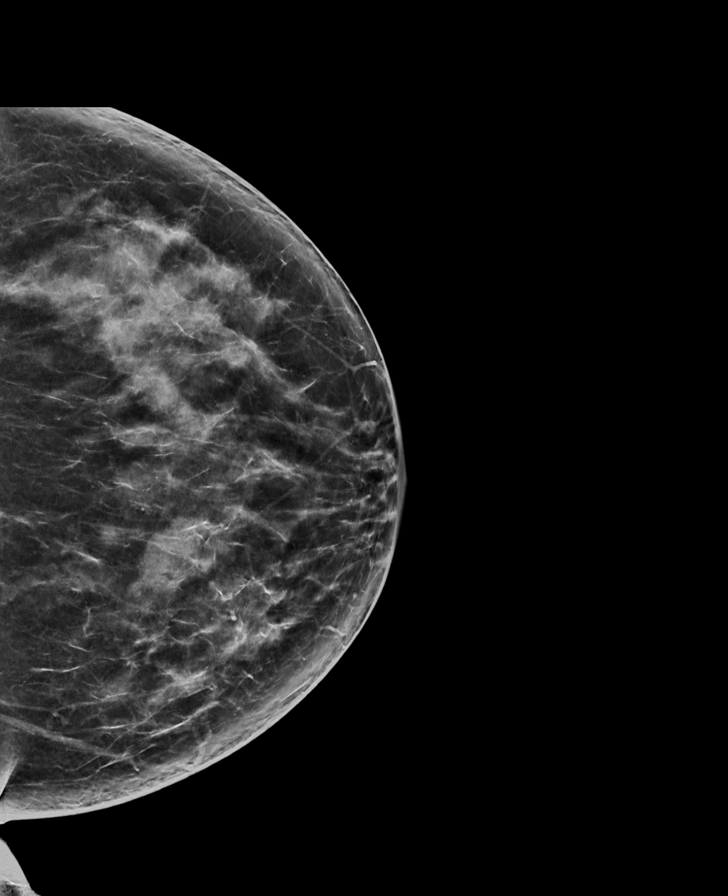

[R MLO synth-2D]
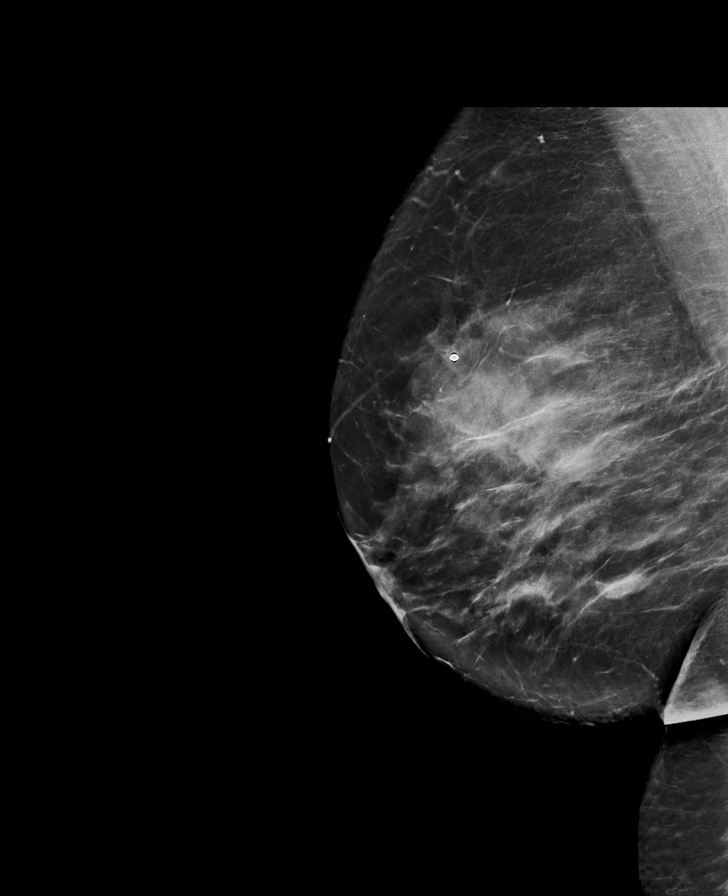

[L MLO synth-2D]
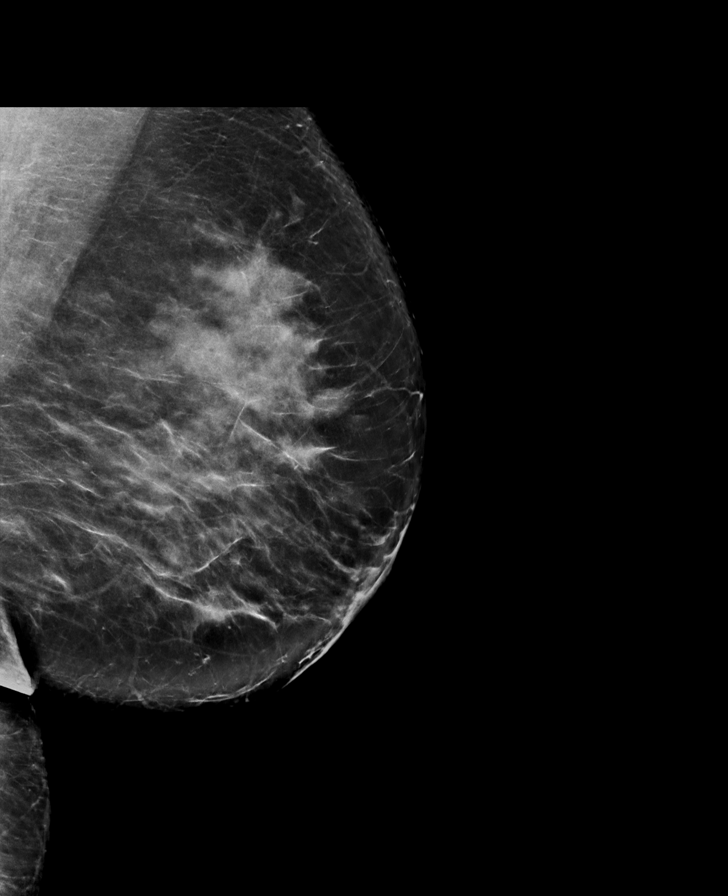

[R TAN synth-2D]
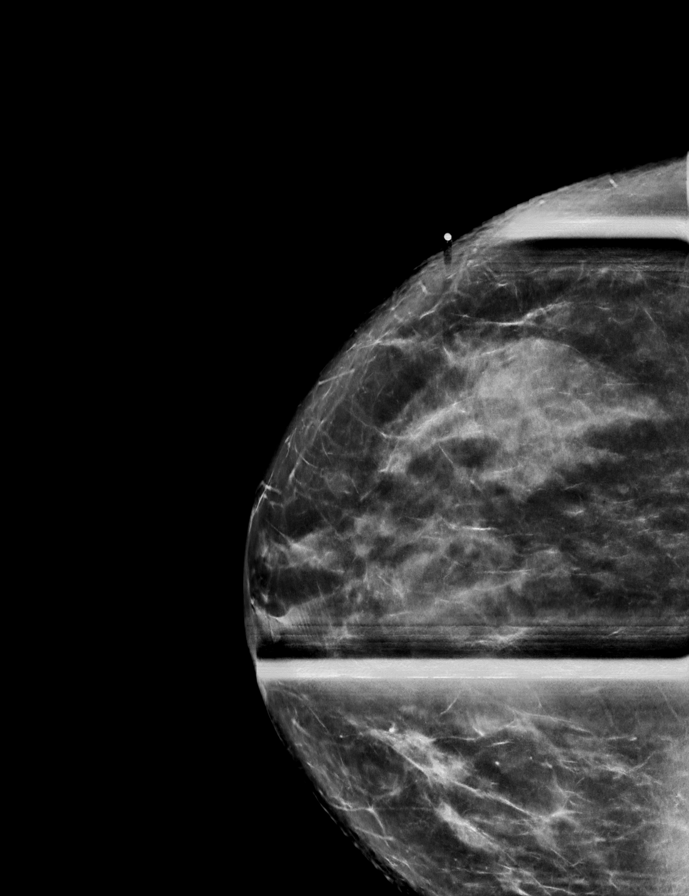

[R CC]
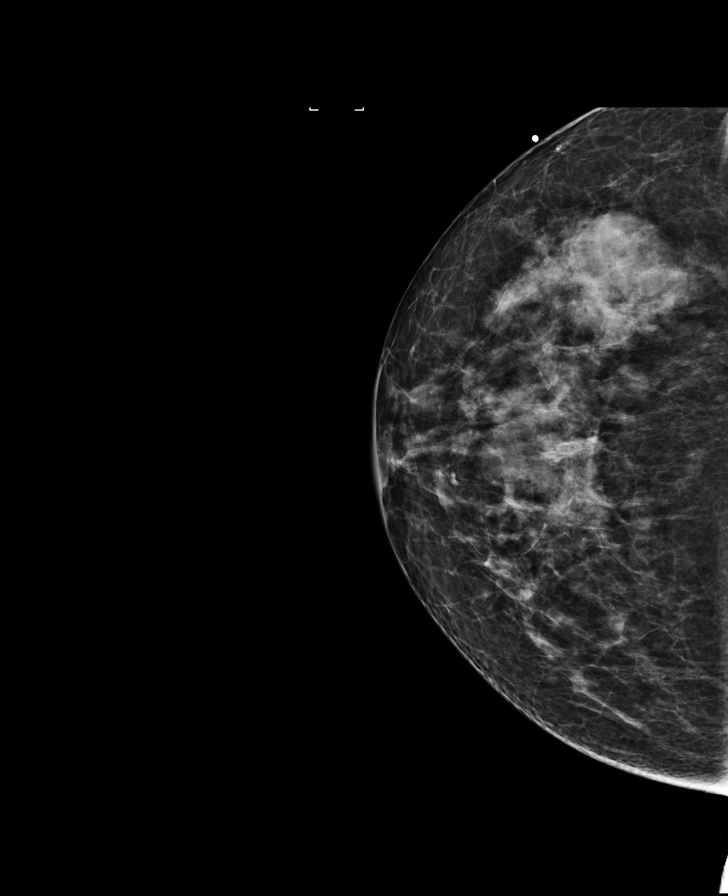

[L CC]
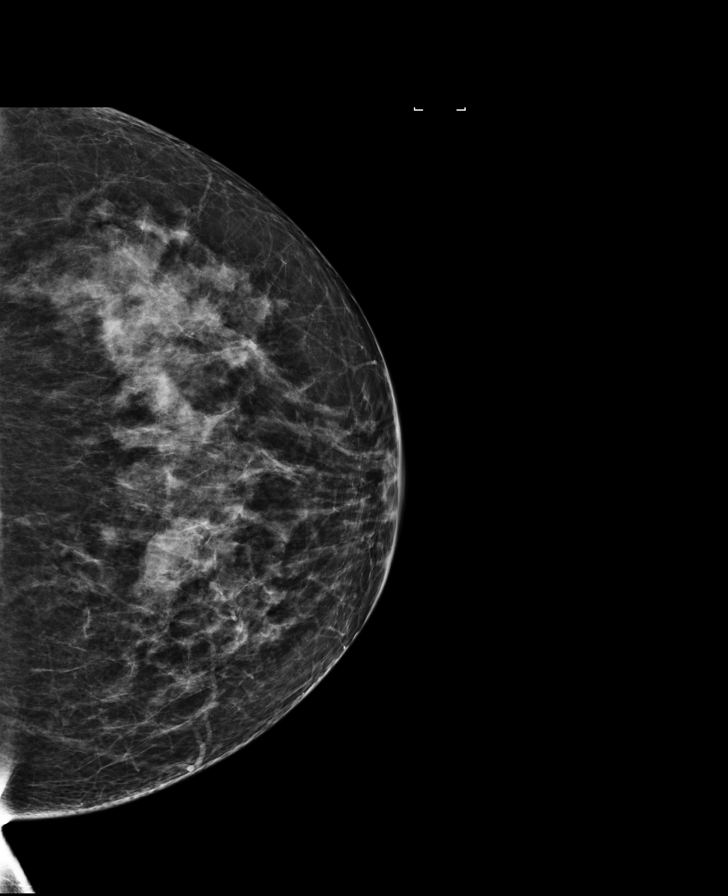

[R TAN]
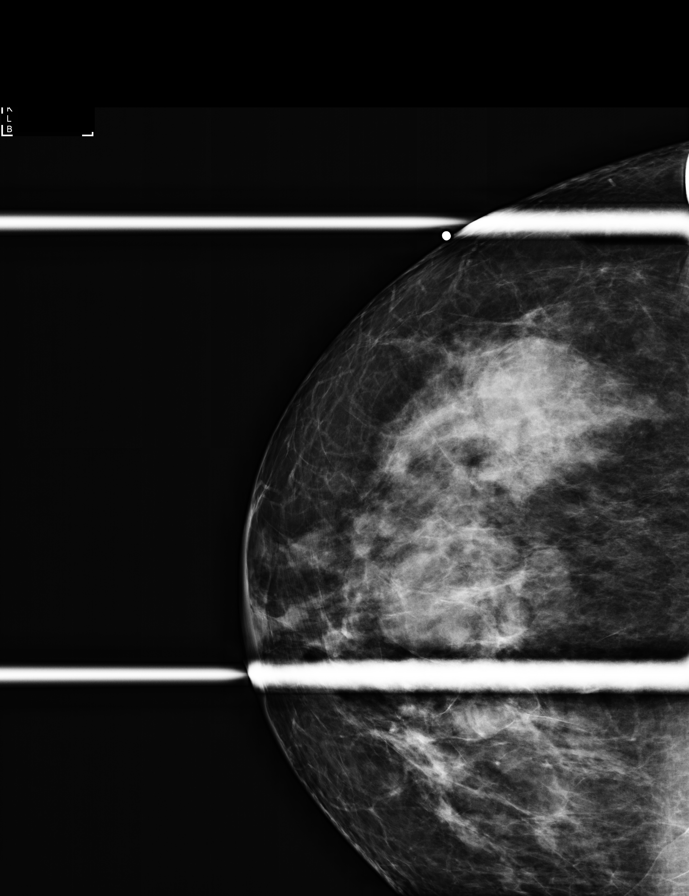

[8 of 35 positions shown; findings below may reference images not displayed]

ACR Breast Density Category c: The breast tissue is heterogeneously
dense, which may obscure small masses.
FINDINGS: Bilateral CC and MLO projections were obtained with 3D
tomosynthesis. Additional spot compression view, with 3D
tomosynthesis, was obtained for the region of the right breast
palpable abnormality which is demarcated with an overlying skin
marker.

There is a round mass within the outer right breast, with
predominantly circumscribed but partially obscured margins, at
middle to posterior depth, measuring approximately 3 cm greatest
dimension, corresponding to the site of the palpable abnormality.
Multiple additional oval and round circumscribed masses are seen
within the right breast, a benign finding.

Multiple round and oval circumscribed masses are seen within the
left breast, an additional benign finding.

Mammographic images were processed with CAD.

Targeted ultrasound is performed, showing an oval cyst within the
right breast at the 9 o'clock axis, 6 cm from the nipple, with
partially indistinct walls, measuring 3 x 1.7 x 2.5 cm,
corresponding to the palpable abnormality and mammographic finding.
IMPRESSION: Right breast cyst, with partially indistinct margins anteriorly,
located at the 9 o'clock axis, 6 cm from the nipple, measuring 3 x
1.7 x 2.5 cm, corresponding to the palpable abnormality and
mammographic finding. Given the slightly indistinct walls, and
associated palpability in conjunction with recent pain,
ultrasound-guided cyst aspiration is recommended.

RECOMMENDATION:
Ultrasound-guided aspiration of the right breast cyst at the 9
o'clock axis, 6 cm from the nipple, measuring 3 x 1.7 x 2.5 cm,
corresponding to the palpable abnormality and mammographic finding.
If the mass does not completely resolve with aspiration, core biopsy
will be performed.

Physician's office will be contacted and ultrasound-guided cyst
aspiration/biopsy will be scheduled at the patient's earliest
convenience.

I have discussed the findings and recommendations with the patient.
Results were also provided in writing at the conclusion of the
visit. If applicable, a reminder letter will be sent to the patient
regarding the next appointment.

BI-RADS CATEGORY  Category 4A: Low suspicion for malignancy.

## 2017-07-18 ENCOUNTER — Other Ambulatory Visit: Payer: Self-pay

## 2017-07-18 ENCOUNTER — Encounter: Payer: Self-pay | Admitting: Emergency Medicine

## 2017-07-18 ENCOUNTER — Emergency Department: Payer: No Typology Code available for payment source

## 2017-07-18 ENCOUNTER — Emergency Department
Admission: EM | Admit: 2017-07-18 | Discharge: 2017-07-18 | Disposition: A | Discharge status: Home / Self Care | Payer: No Typology Code available for payment source | Attending: Emergency Medicine | Admitting: Emergency Medicine

## 2017-07-18 DIAGNOSIS — Z79899 Other long term (current) drug therapy: Secondary | ICD-10-CM | POA: Insufficient documentation

## 2017-07-18 DIAGNOSIS — M47812 Spondylosis without myelopathy or radiculopathy, cervical region: Secondary | ICD-10-CM | POA: Insufficient documentation

## 2017-07-18 DIAGNOSIS — F17210 Nicotine dependence, cigarettes, uncomplicated: Secondary | ICD-10-CM | POA: Insufficient documentation

## 2017-07-18 DIAGNOSIS — E119 Type 2 diabetes mellitus without complications: Secondary | ICD-10-CM | POA: Insufficient documentation

## 2017-07-18 DIAGNOSIS — I509 Heart failure, unspecified: Secondary | ICD-10-CM | POA: Insufficient documentation

## 2017-07-18 DIAGNOSIS — M4712 Other spondylosis with myelopathy, cervical region: Secondary | ICD-10-CM

## 2017-07-18 DIAGNOSIS — I110 Hypertensive heart disease with heart failure: Secondary | ICD-10-CM | POA: Insufficient documentation

## 2017-07-18 MED ORDER — CYCLOBENZAPRINE HCL 10 MG PO TABS: 10.0000 mg | ORAL_TABLET | Freq: Three times a day (TID) | ORAL | 0 refills | Status: AC | PRN

## 2017-07-18 MED ORDER — TRAMADOL HCL 50 MG PO TABS: 50.0000 mg | ORAL_TABLET | Freq: Two times a day (BID) | ORAL | 0 refills | Status: AC | PRN

## 2017-07-18 MED ORDER — MELOXICAM 15 MG PO TABS: 15.0000 mg | ORAL_TABLET | Freq: Every day | ORAL | 0 refills | Status: DC

## 2017-07-18 NOTE — ED Notes (Signed)
See triage note  Presents with pain to right side of head  States she hit her head on cabinet about 1 week ago  Then developed pain from right side of neck into head  Min relief with ibu  Positive nausea

## 2017-07-18 NOTE — ED Triage Notes (Signed)
Says pain neck that shoots up to behind right ear.  Says it is not a headache.  Has had for 5-6 days and no relief with ibuprofen.

## 2017-07-18 NOTE — ED Provider Notes (Signed)
[facility] Emergency Department Provider Note   ____________________________________________   First MD Initiated Contact with Patient 07/18/17 [phone]     (approximate)  I have reviewed the triage vital signs and the nursing notes.   HISTORY  Chief Complaint Torticollis    HPI Michele Freeman is a 52 y.o. female patient presents with 5 days of non-provocative right neck pain.  Patient denies radicular component to her neck pain.  Patient denies headache associated with this complaint.  Patient state pain increased with coughing and flexion of the neck.  Patient denies fever.  Patient state no relief with over-the-counter ibuprofen.  Patient rates the pain as a 9/10.  Patient described the pain is "achy".  Past Medical History:  Diagnosis Date  . CHF (congestive heart failure) (HCC)   . Diabetes mellitus without complication (HCC)   . Hyperlipidemia   . Hypertension     Patient Active Problem List   Diagnosis Date Noted  . HTN (hypertension) 02/13/2016  . Diabetes (HCC) 02/13/2016  . Tobacco use 02/13/2016  . Sleep disturbance 02/13/2016  . CHF (congestive heart failure) (HCC) 01/31/2016    Past Surgical History:  Procedure Laterality Date  . ABDOMINAL HYSTERECTOMY    . BREAST BIOPSY Right 12/08/2014   SCAR-LIKE FIBROSIS AND  . BREAST CYST ASPIRATION Right 12/08/2014    Prior to Admission medications   Medication Sig Start Date End Date Taking? Authorizing Provider  amLODipine (NORVASC) 10 MG tablet Take 1 tablet (10 mg total) by mouth daily. 02/01/16   Shaune Pollack, MD  cetirizine (ZYRTEC) 10 MG tablet Take 1 tablet (10 mg total) by mouth daily. 01/11/16   Cuthriell, Delorise Royals, PA-C  cyclobenzaprine (FLEXERIL) 10 MG tablet Take 1 tablet (10 mg total) by mouth 3 (three) times daily as needed. 07/18/17   Joni Reining, PA-C  fluticasone (FLONASE) 50 MCG/ACT nasal spray Place 1 spray into both nostrils 2 (two) times daily. 01/11/16   Cuthriell,  Delorise Royals, PA-C  furosemide (LASIX) 40 MG tablet Take 40 mg by mouth daily.    [provider]  lisinopril (PRINIVIL,ZESTRIL) 10 MG tablet Take 1 tablet (10 mg total) by mouth daily. 02/02/16   Shaune Pollack, MD  meloxicam (MOBIC) 15 MG tablet Take 1 tablet (15 mg total) by mouth daily. 07/18/17   Joni Reining, PA-C  metFORMIN (GLUCOPHAGE) 500 MG tablet Take 1 tablet by mouth 2 (two) times daily. 09/28/14 01/30/17  [provider]  traMADol (ULTRAM) 50 MG tablet Take 1 tablet (50 mg total) by mouth every 12 (twelve) hours as needed. 07/18/17   Joni Reining, PA-C    Allergies No known allergies  Family History  Problem Relation Age of Onset  . CAD Father   . Diabetes Mother   . Hypertension Mother   . Thyroid disease Mother   . Heart failure Maternal Aunt   . Heart failure Maternal Uncle   . CAD Maternal Grandfather   . Heart failure Maternal Aunt   . Breast cancer Neg Hx     Social History Social History   Tobacco Use  . Smoking status: Current Every Day Smoker    Packs/day: 0.50    Types: Cigarettes  . Smokeless tobacco: Never Used  . Tobacco comment: gave information on free  Quit Smoking classes  Substance Use Topics  . Alcohol use: Yes    Alcohol/week: 1.2 oz    Types: 2 Standard drinks or equivalent per week    Comment: occassional  .  Drug use: No    Review of Systems  Constitutional: No fever/chills Eyes: No visual changes. ENT: No sore throat. Cardiovascular: Denies chest pain. Respiratory: Denies shortness of breath. Gastrointestinal: No abdominal pain.  No nausea, no vomiting.  No diarrhea.  No constipation. Genitourinary: Negative for dysuria. Musculoskeletal: Negative for back pain. Skin: Negative for rash. Neurological: Negative for headaches, focal weakness or numbness. Endocrine: Diabetes and hypertension. Hematological/Lymphatic: Allergic/Immunilogical: **}  ____________________________________________   PHYSICAL  EXAM:  VITAL SIGNS: ED Triage Vitals  Enc Vitals Group     BP 07/18/17 0930 (!) 155/86     Pulse Rate 07/18/17 0930 89     Resp --      Temp 07/18/17 0930 98.3 F (36.8 C)     Temp Source 07/18/17 0930 Oral     SpO2 07/18/17 0930 96 %     Weight 07/18/17 0925 195 lb (88.5 kg)     Height 07/18/17 0925 5\' 7"  (1.702 m)     Head Circumference --      Peak Flow --      Pain Score 07/18/17 0925 9     Pain Loc --      Pain Edu? --      Excl. in GC? --     Constitutional: Alert and oriented. Well appearing and in no acute distress. Neck: No stridor.  Hematological/Lymphatic/Immunilogical: No cervical lymphadenopathy. Cardiovascular: Normal rate, regular rhythm. Grossly normal heart sounds.  Good peripheral circulation.  Elevated blood pressure. Respiratory: Normal respiratory effort.  No retractions. Lungs CTAB. Musculoskeletal: No lower extremity tenderness nor edema.  No joint effusions. Neurologic:  Normal speech and language. No gross focal neurologic deficits are appreciated. No gait instability. Skin:  Skin is warm, dry and intact. No rash noted. Psychiatric: Mood and affect are normal. Speech and behavior are normal.  ____________________________________________   LABS (all labs ordered are listed, but only abnormal results are displayed)  Labs Reviewed - No data to display ____________________________________________  EKG   ____________________________________________  RADIOLOGY  ED MD interpretation:    Official radiology report(s): Dg Cervical Spine 2-3 Views  Result Date: 07/18/2017 CLINICAL DATA:  The patient struck her head on a cabinet 1 week ago. Neck pain began a few days later. Initial encounter. EXAM: CERVICAL SPINE - 2-3 VIEW COMPARISON:  None. FINDINGS: There is no fracture or malalignment. Loss of disc space height and endplate spurring are seen from C4-C7. There is some facet degenerative disease at C6-7 and C7-T1. Prevertebral soft tissues appear  normal. Lung apices are clear. IMPRESSION: No acute abnormality. Degenerative disc disease C4-C7. Electronically Signed   By: Drusilla Kanner M.D.   On: 07/18/2017 10:39    ____________________________________________   PROCEDURES  Procedure(s) performed: None  Procedures  Critical Care performed: No  ____________________________________________   INITIAL IMPRESSION / ASSESSMENT AND PLAN / ED COURSE  As part of my medical decision making, I reviewed the following data within the electronic MEDICAL RECORD NUMBER    Neck pain secondary to degenerative changes of the cervical spine.  Gust x-ray findings with patient.  Patient given discharge care instruction advised follow-up family doctor.      ____________________________________________   FINAL CLINICAL IMPRESSION(S) / ED DIAGNOSES  Final diagnoses:  Osteoarthritis of cervical spine with myelopathy     ED Discharge Orders        Ordered    traMADol (ULTRAM) 50 MG tablet  Every 12 hours PRN     07/18/17 1057    cyclobenzaprine (FLEXERIL) 10  MG tablet  3 times daily PRN     07/18/17 1057    meloxicam (MOBIC) 15 MG tablet  Daily     07/18/17 1057       Note:  This document was prepared using Dragon voice recognition software and may include unintentional dictation errors.    Joni ReiningSmith, Ronald K, PA-C 07/18/17 1059    Jene EveryKinner, Robert, MD 07/18/17 [phone]

## 2017-07-23 ENCOUNTER — Other Ambulatory Visit: Payer: Self-pay | Admitting: Physician Assistant

## 2017-07-23 DIAGNOSIS — Z1231 Encounter for screening mammogram for malignant neoplasm of breast: Secondary | ICD-10-CM

## 2017-08-23 ENCOUNTER — Ambulatory Visit
Admission: RE | Admit: 2017-08-23 | Discharge: 2017-08-23 | Disposition: A | Discharge status: Home / Self Care | Payer: No Typology Code available for payment source | Source: Ambulatory Visit | Attending: Physician Assistant | Admitting: Physician Assistant

## 2017-08-23 DIAGNOSIS — Z1231 Encounter for screening mammogram for malignant neoplasm of breast: Secondary | ICD-10-CM | POA: Diagnosis present

## 2017-09-23 ENCOUNTER — Encounter: Payer: Self-pay | Admitting: *Deleted

## 2017-09-24 ENCOUNTER — Ambulatory Visit: Payer: No Typology Code available for payment source | Admitting: Certified Registered Nurse Anesthetist

## 2017-09-24 ENCOUNTER — Encounter: Payer: Self-pay | Admitting: *Deleted

## 2017-09-24 ENCOUNTER — Ambulatory Visit
Admission: RE | Admit: 2017-09-24 | Discharge: 2017-09-24 | Disposition: A | Discharge status: Home / Self Care | Payer: No Typology Code available for payment source | Source: Ambulatory Visit | Attending: Internal Medicine | Admitting: Internal Medicine

## 2017-09-24 ENCOUNTER — Encounter
Admission: RE | Disposition: A | Discharge status: Home / Self Care | Payer: Self-pay | Source: Ambulatory Visit | Attending: Internal Medicine

## 2017-09-24 DIAGNOSIS — Z1211 Encounter for screening for malignant neoplasm of colon: Secondary | ICD-10-CM | POA: Diagnosis present

## 2017-09-24 DIAGNOSIS — I340 Nonrheumatic mitral (valve) insufficiency: Secondary | ICD-10-CM | POA: Diagnosis not present

## 2017-09-24 DIAGNOSIS — I509 Heart failure, unspecified: Secondary | ICD-10-CM | POA: Insufficient documentation

## 2017-09-24 DIAGNOSIS — Z90710 Acquired absence of both cervix and uterus: Secondary | ICD-10-CM | POA: Insufficient documentation

## 2017-09-24 DIAGNOSIS — Z79899 Other long term (current) drug therapy: Secondary | ICD-10-CM | POA: Diagnosis not present

## 2017-09-24 DIAGNOSIS — K621 Rectal polyp: Secondary | ICD-10-CM | POA: Insufficient documentation

## 2017-09-24 DIAGNOSIS — Z7984 Long term (current) use of oral hypoglycemic drugs: Secondary | ICD-10-CM | POA: Diagnosis not present

## 2017-09-24 DIAGNOSIS — E119 Type 2 diabetes mellitus without complications: Secondary | ICD-10-CM | POA: Insufficient documentation

## 2017-09-24 DIAGNOSIS — K5730 Diverticulosis of large intestine without perforation or abscess without bleeding: Secondary | ICD-10-CM | POA: Diagnosis not present

## 2017-09-24 DIAGNOSIS — E785 Hyperlipidemia, unspecified: Secondary | ICD-10-CM | POA: Diagnosis not present

## 2017-09-24 DIAGNOSIS — I110 Hypertensive heart disease with heart failure: Secondary | ICD-10-CM | POA: Diagnosis not present

## 2017-09-24 DIAGNOSIS — Z888 Allergy status to other drugs, medicaments and biological substances status: Secondary | ICD-10-CM | POA: Diagnosis not present

## 2017-09-24 DIAGNOSIS — K640 First degree hemorrhoids: Secondary | ICD-10-CM | POA: Insufficient documentation

## 2017-09-24 DIAGNOSIS — N6019 Diffuse cystic mastopathy of unspecified breast: Secondary | ICD-10-CM | POA: Insufficient documentation

## 2017-09-24 DIAGNOSIS — F17200 Nicotine dependence, unspecified, uncomplicated: Secondary | ICD-10-CM | POA: Diagnosis not present

## 2017-09-24 HISTORY — PX: COLONOSCOPY WITH PROPOFOL: SHX5780

## 2017-09-24 HISTORY — DX: Diffuse cystic mastopathy of unspecified breast: N60.19

## 2017-09-24 HISTORY — DX: Nontoxic multinodular goiter: E04.2

## 2017-09-24 SURGERY — COLONOSCOPY WITH PROPOFOL
Anesthesia: General

## 2017-09-24 MED ORDER — PROPOFOL 10 MG/ML IV BOLUS
INTRAVENOUS | Status: DC | PRN
  Administered 2017-09-24: 50 mg via INTRAVENOUS
  Administered 2017-09-24: 20 mg via INTRAVENOUS

## 2017-09-24 MED ORDER — SODIUM CHLORIDE 0.9 % IV SOLN
INTRAVENOUS | Status: DC
  Administered 2017-09-24: 1000 mL via INTRAVENOUS

## 2017-09-24 MED ORDER — LIDOCAINE HCL (CARDIAC) PF 100 MG/5ML IV SOSY
PREFILLED_SYRINGE | INTRAVENOUS | Status: DC | PRN
  Administered 2017-09-24: 50 mg via INTRAVENOUS

## 2017-09-24 MED ORDER — PROPOFOL 500 MG/50ML IV EMUL
INTRAVENOUS | Status: AC
  Filled 2017-09-24: qty 50

## 2017-09-24 MED ORDER — PROPOFOL 500 MG/50ML IV EMUL
INTRAVENOUS | Status: DC | PRN
  Administered 2017-09-24: 150 ug/kg/min via INTRAVENOUS

## 2017-09-24 NOTE — Interval H&P Note (Signed)
History and Physical Interval Note:  09/24/2017 8:38 AM  Vertell LimberEunice L Gilham  has presented today for surgery, with the diagnosis of COLON CANCER SCREENING  The various methods of treatment have been discussed with the patient and family. After consideration of risks, benefits and other options for treatment, the patient has consented to  Procedure(s): COLONOSCOPY WITH PROPOFOL (N/A) as a surgical intervention .  The patient's history has been reviewed, patient examined, no change in status, stable for surgery.  I have reviewed the patient's chart and labs.  Questions were answered to the patient's satisfaction.     [place], [place]

## 2017-09-24 NOTE — Transfer of Care (Signed)
Immediate Anesthesia Transfer of Care Note  Patient: Vertell Limberunice L Calamia  Procedure(s) Performed: COLONOSCOPY WITH PROPOFOL (N/A )  Patient Location: PACU  Anesthesia Type:General  Level of Consciousness: sedated  Airway & Oxygen Therapy: Patient Spontanous Breathing and Patient connected to nasal cannula oxygen  Post-op Assessment: Report given to RN and Post -op Vital signs reviewed and stable  Post vital signs: Reviewed and stable  Last Vitals:  Vitals Value Taken Time  BP 119/64 09/24/2017  9:27 AM  Temp 35.8 C 09/24/2017  9:27 AM  Pulse 76 09/24/2017  9:27 AM  Resp 14 09/24/2017  9:27 AM  SpO2 99 % 09/24/2017  9:27 AM    Last Pain:  Vitals:   09/24/17 0927  TempSrc: Tympanic  PainSc: Asleep      Patients Stated Pain Goal: 0 (09/24/17 0831)  Complications: No apparent anesthesia complications

## 2017-09-24 NOTE — Anesthesia Preprocedure Evaluation (Addendum)
Anesthesia Evaluation  Patient identified by MRN, date of birth, ID band Patient awake    Reviewed: Allergy & Precautions, H&P , NPO status , Patient's Chart, lab work & pertinent test results  History of Anesthesia Complications Negative for: history of anesthetic complications  Airway Mallampati: II  TM Distance: >3 FB Neck ROM: full    Dental  (+) Teeth Intact   Pulmonary Current Smoker,    breath sounds clear to auscultation       Cardiovascular Exercise Tolerance: Good hypertension, +CHF   Rhythm:regular Rate:Normal  Echo 01/31/16: Left ventricle: The cavity size was mildly dilated. Systolic   function was severely reduced. The estimated ejection fraction   was in the range of 20% to 25%. Diffuse hypokinesis. Regional   wall motion abnormalities cannot be excluded. - Mitral valve: There was mild to moderate regurgitation. - Left atrium: The atrium was mildly dilated. - Right ventricle: Systolic function was normal. - Pulmonary arteries: Systolic pressure was within the normal   range.  METS>4   Neuro/Psych negative neurological ROS  negative psych ROS   GI/Hepatic negative GI ROS, Neg liver ROS,   Endo/Other  negative endocrine ROSdiabetes  Renal/GU negative Renal ROS  negative genitourinary   Musculoskeletal   Abdominal   Peds  Hematology negative hematology ROS (+)   Anesthesia Other Findings Past Medical History: No date: CHF (congestive heart failure) (HCC) No date: Diabetes mellitus without complication (HCC) No date: Fibrocystic disease of breast No date: Hyperlipidemia No date: Hypertension No date: Multiple thyroid nodules  Past Surgical History: No date: ABDOMINAL HYSTERECTOMY 12/08/2014: BREAST BIOPSY; Right     Comment:  SCAR-LIKE FIBROSIS AND 12/08/2014: BREAST CYST ASPIRATION; Right No date: TUBAL LIGATION  BMI    Body Mass Index:  28.19 kg/m       Reproductive/Obstetrics negative OB ROS                           Anesthesia Physical Anesthesia Plan  ASA: III  Anesthesia Plan: General   Post-op Pain Management:    Induction:   PONV Risk Score and Plan: Propofol infusion and TIVA  Airway Management Planned: Natural Airway and Nasal Cannula  Additional Equipment:   Intra-op Plan:   Post-operative Plan:   Informed Consent: I have reviewed the patients History and Physical, chart, labs and discussed the procedure including the risks, benefits and alternatives for the proposed anesthesia with the patient or authorized representative who has indicated his/her understanding and acceptance.   Dental Advisory Given  Plan Discussed with: Anesthesiologist, CRNA and Surgeon  Anesthesia Plan Comments:         Anesthesia Quick Evaluation

## 2017-09-24 NOTE — H&P (Signed)
Outpatient short stay form Pre-procedure 09/24/2017 8:37 AM Teodoro K. Norma Fredricksonoledo, M.D.  Primary Physician: Maurine MinisterMiriam McLaughlin, PA  Reason for visit:  Colon cancer screening  History of present illness:  Patient presents for colonoscopy for colon cancer screening. The patient denies complaints of abdominal pain, significant change in bowel habits, or rectal bleeding.    No current facility-administered medications for this encounter.   Medications Prior to Admission  Medication Sig Dispense Refill Last Dose  . amLODipine (NORVASC) 10 MG tablet Take 1 tablet (10 mg total) by mouth daily. 30 tablet 2 09/24/2017 at Unknown time  . carvedilol (COREG) 3.125 MG tablet Take 3.125 mg by mouth 2 (two) times daily with a meal.   09/24/2017 at Unknown time  . cetirizine (ZYRTEC) 10 MG tablet Take 1 tablet (10 mg total) by mouth daily. 30 tablet 0 Past Week at Unknown time  . cyclobenzaprine (FLEXERIL) 10 MG tablet Take 1 tablet (10 mg total) by mouth 3 (three) times daily as needed. 15 tablet 0 Past Week at Unknown time  . fluticasone (FLONASE) 50 MCG/ACT nasal spray Place 1 spray into both nostrils 2 (two) times daily. 16 g 0 Past Week at Unknown time  . lisinopril (PRINIVIL,ZESTRIL) 10 MG tablet Take 1 tablet (10 mg total) by mouth daily. 30 tablet 2 09/24/2017 at Unknown time  . losartan (COZAAR) 100 MG tablet Take 100 mg by mouth daily.     . meloxicam (MOBIC) 15 MG tablet Take 1 tablet (15 mg total) by mouth daily. 30 tablet 0 Past Week at Unknown time  . traMADol (ULTRAM) 50 MG tablet Take 1 tablet (50 mg total) by mouth every 12 (twelve) hours as needed. 12 tablet 0 Past Week at Unknown time  . furosemide (LASIX) 40 MG tablet Take 40 mg by mouth daily.   Not Taking at Unknown time  . metFORMIN (GLUCOPHAGE) 500 MG tablet Take 1 tablet by mouth 2 (two) times daily.   Taking     Allergies  Allergen Reactions  . Lisinopril   . No Known Allergies      Past Medical History:  Diagnosis Date  . CHF  (congestive heart failure) (HCC)   . Diabetes mellitus without complication (HCC)   . Fibrocystic disease of breast   . Hyperlipidemia   . Hypertension   . Multiple thyroid nodules     Review of systems:  Otherwise negative.    Physical Exam  Gen: Alert, oriented. Appears stated age.  HEENT: [place]/AT. PERRLA. Lungs: CTA, no wheezes. CV: RR nl S1, S2. Abd: soft, benign, no masses. BS+ Ext: No edema. Pulses 2+    Planned procedures: Proceed with colonoscopy. The patient understands the nature of the planned procedure, indications, risks, alternatives and potential complications including but not limited to bleeding, infection, perforation, damage to internal organs and possible oversedation/side effects from anesthesia. The patient agrees and gives consent to proceed.  Please refer to procedure notes for findings, recommendations and patient disposition/instructions.     Teodoro K. Norma Fredricksonoledo, M.D. Gastroenterology 09/24/2017  8:37 AM

## 2017-09-24 NOTE — Anesthesia Postprocedure Evaluation (Signed)
Anesthesia Post Note  Patient: Vertell Limberunice L Taormina  Procedure(s) Performed: COLONOSCOPY WITH PROPOFOL (N/A )  Patient location during evaluation: PACU Anesthesia Type: General Level of consciousness: awake and alert Pain management: pain level controlled Vital Signs Assessment: post-procedure vital signs reviewed and stable Respiratory status: spontaneous breathing, nonlabored ventilation, respiratory function stable and patient connected to nasal cannula oxygen Cardiovascular status: blood pressure returned to baseline and stable Postop Assessment: no apparent nausea or vomiting Anesthetic complications: no     Last Vitals:  Vitals:   09/24/17 0947 09/24/17 0957  BP: 128/86 (!) 152/94  Pulse: 76 70  Resp: 14 10  Temp:    SpO2: 99% 99%    Last Pain:  Vitals:   09/24/17 0957  TempSrc:   PainSc: 0-No pain                 Jovita GammaKathryn L Fitzgerald

## 2017-09-24 NOTE — Anesthesia Post-op Follow-up Note (Signed)
Anesthesia QCDR form completed.        

## 2017-09-24 NOTE — Op Note (Signed)
[facility] Patient Name: Michele Freeman Procedure Date: 09/24/2017 7:45 AM MRN: [id number] Account #: [id number] Date of Birth: 28-Oct-1965 Admit Type: Outpatient Age: 52 Room: [facility] ENDO ROOM 2 Gender: Female Note Status: Finalized Procedure:            Colonoscopy Indications:          Screening for colorectal malignant neoplasm Providers:            Boykin Nearing. Norma Fredrickson MD, MD Referring MD:         Danella Penton, MD (Referring MD) Medicines:            Propofol per Anesthesia Complications:        No immediate complications. Procedure:            Pre-Anesthesia Assessment:                       - The risks and benefits of the procedure and the                        sedation options and risks were discussed with the                        patient. All questions were answered and informed                        consent was obtained.                       - Patient identification and proposed procedure were                        verified prior to the procedure by the nurse. The                        procedure was verified in the procedure room.                       - ASA Grade Assessment: II - A patient with mild                        systemic disease.                       - After reviewing the risks and benefits, the patient                        was deemed in satisfactory condition to undergo the                        procedure.                       After obtaining informed consent, the colonoscope was                        passed under direct vision. Throughout the procedure,                        the patient's blood pressure, pulse, and oxygen  saturations were monitored continuously. The                        Colonoscope was introduced through the anus and                        advanced to the the cecum, identified by appendiceal                        orifice and ileocecal valve. The colonoscopy was                  performed without difficulty. The patient tolerated the                        procedure well. The quality of the bowel preparation                        was good. The ileocecal valve, appendiceal orifice, and                        rectum were photographed. Findings:      The perianal and digital rectal examinations were normal. Pertinent       negatives include normal sphincter tone and no palpable rectal lesions.      A few small-mouthed diverticula were found in the sigmoid colon and       ascending colon.      Two sessile polyps were found in the rectum. The polyps were 2 to 3 mm       in size. These polyps were removed with a jumbo cold forceps. Resection       and retrieval were complete.      Non-bleeding internal hemorrhoids were found during retroflexion. The       hemorrhoids were Grade I (internal hemorrhoids that do not prolapse).      The exam was otherwise without abnormality. Impression:           - Diverticulosis in the sigmoid colon and in the                        ascending colon.                       - Two 2 to 3 mm polyps in the rectum, removed with a                        jumbo cold forceps. Resected and retrieved.                       - Non-bleeding internal hemorrhoids.                       - The examination was otherwise normal. Recommendation:       - Patient has a contact number available for                        emergencies. The signs and symptoms of potential                        delayed complications were discussed with the patient.  Return to normal activities tomorrow. Written discharge                        instructions were provided to the patient.                       - Resume previous diet.                       - Continue present medications.                       - Await pathology results.                       - Repeat colonoscopy for surveillance based on                        pathology results.                        - Return to GI office PRN.                       - The findings and recommendations were discussed with                        the patient and their family. Procedure Code(s):    --- Professional ---                       [id number], Colonoscopy, flexible; with biopsy, single or                        multiple Diagnosis Code(s):    --- Professional ---                       Z12.11, Encounter for screening for malignant neoplasm                        of colon                       K64.0, First degree hemorrhoids                       K62.1, Rectal polyp                       K57.30, Diverticulosis of large intestine without                        perforation or abscess without bleeding CPT copyright 2017 American Medical Association. All rights reserved. The codes documented in this report are preliminary and upon coder review may  be revised to meet current compliance requirements. Stanton Kidney MD, MD 09/24/2017 9:27:48 AM This report has been signed electronically. Number of Addenda: 0 Note Initiated On: 09/24/2017 7:45 AM Scope Withdrawal Time: 0 hours 7 minutes 23 seconds  Total Procedure Duration: 0 hours 11 minutes 38 seconds       [facility]

## 2017-09-24 NOTE — Anesthesia Procedure Notes (Signed)
Date/Time: 09/24/2017 9:05 AM Performed by: Ginger CarneMichelet, Shylie Polo, CRNA Pre-anesthesia Checklist: Patient identified, Emergency Drugs available, Suction available, Patient being monitored and Timeout performed Patient Re-evaluated:Patient Re-evaluated prior to induction Oxygen Delivery Method: Nasal cannula Preoxygenation: Pre-oxygenation with 100% oxygen Induction Type: IV induction

## 2017-09-25 LAB — SURGICAL PATHOLOGY

## 2017-09-26 ENCOUNTER — Encounter: Payer: Self-pay | Admitting: Internal Medicine

## 2018-03-06 IMAGING — DX DG CHEST 1V
1 series · 1 of 1 positions shown · non-contrast
Comparison: None.

CLINICAL DATA: Acute onset of shortness of breath and congestion.
Productive cough. Initial encounter.

EXAM:
CHEST 1 VIEW

[chest ap]
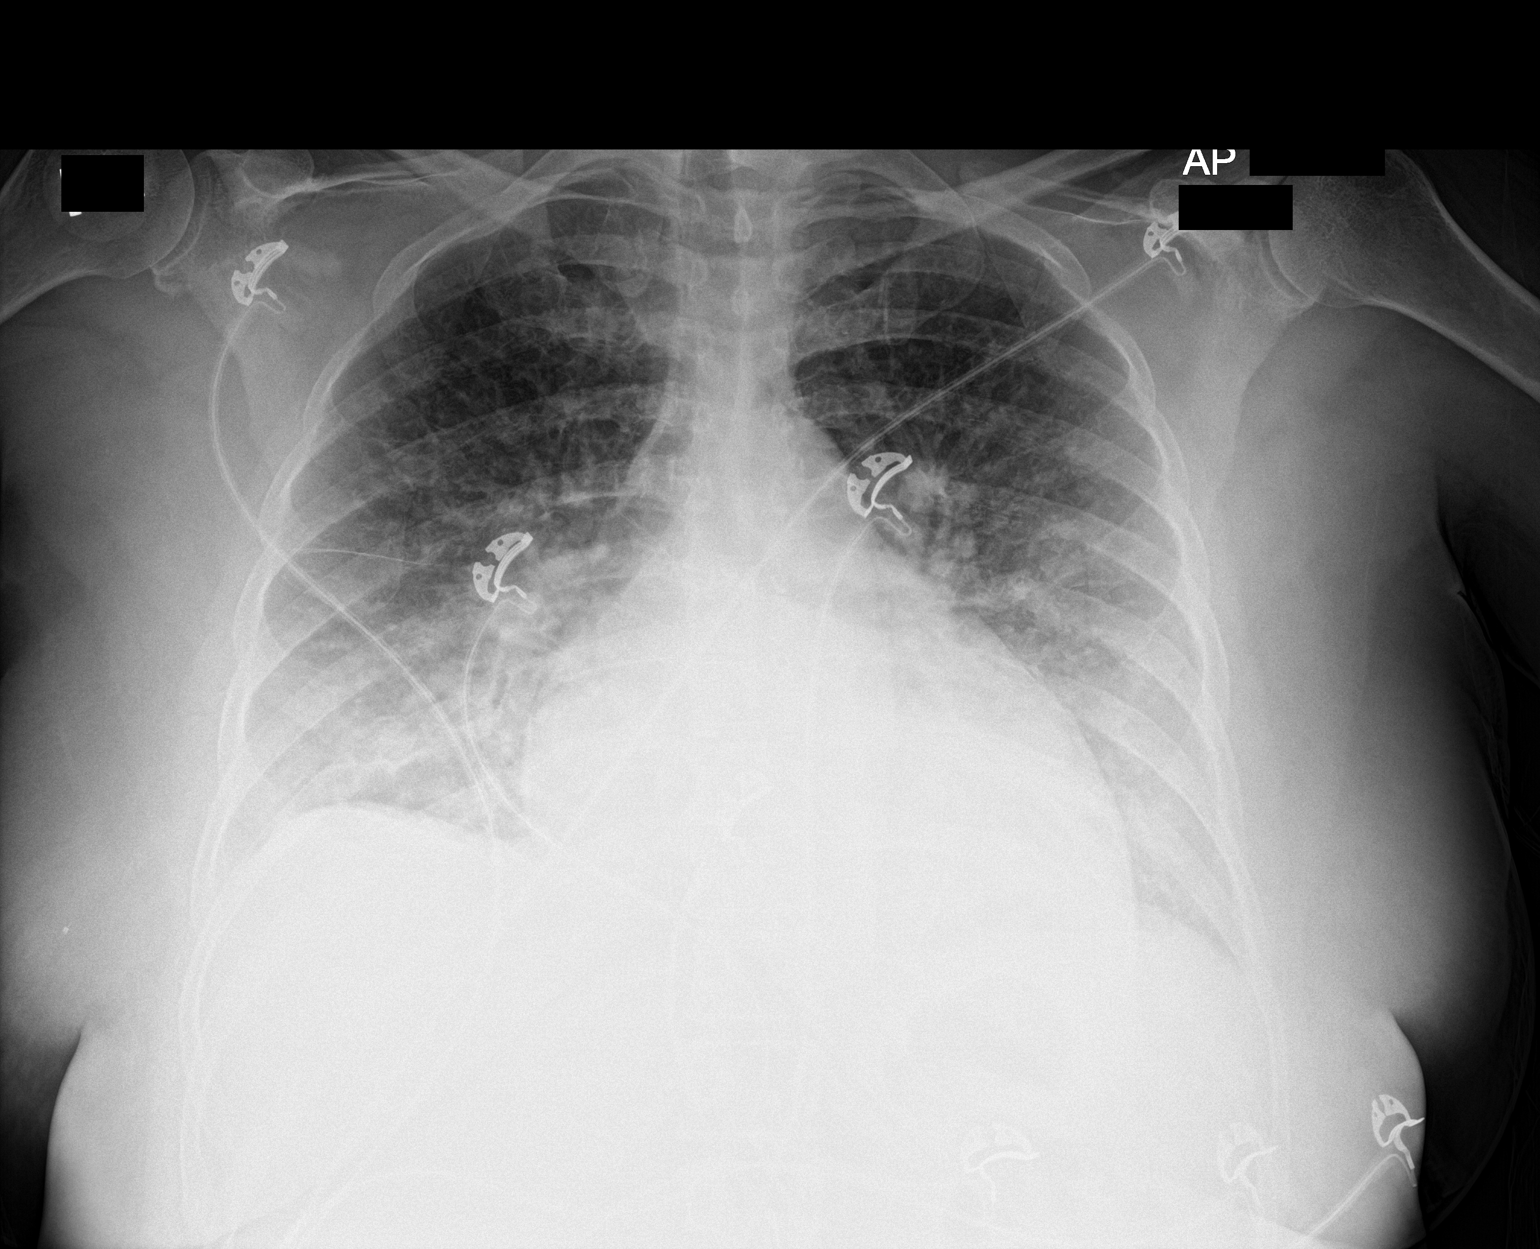

[1 of 1 positions shown; findings below may reference images not displayed]

FINDINGS: The lungs are well-aerated. Vascular congestion is noted. Bibasilar
airspace opacities raise concern for mild interstitial edema, though
pneumonia could have a similar appearance. There is no evidence of
pleural effusion or pneumothorax.

The cardiomediastinal silhouette is mildly enlarged. No acute
osseous abnormalities are seen.
IMPRESSION: Vascular congestion and mild cardiomegaly. Bibasilar airspace
opacities raise concern for mild interstitial edema, though
pneumonia could have a similar appearance.

## 2018-08-01 ENCOUNTER — Other Ambulatory Visit: Payer: Self-pay | Admitting: Physician Assistant

## 2018-08-01 DIAGNOSIS — Z20822 Contact with and (suspected) exposure to covid-19: Secondary | ICD-10-CM

## 2018-08-03 LAB — NOVEL CORONAVIRUS, NAA: SARS-CoV-2, NAA: DETECTED — AB

## 2019-02-28 ENCOUNTER — Other Ambulatory Visit: Payer: Self-pay

## 2019-02-28 ENCOUNTER — Ambulatory Visit: Payer: PRIVATE HEALTH INSURANCE | Attending: Internal Medicine

## 2019-02-28 DIAGNOSIS — Z23 Encounter for immunization: Secondary | ICD-10-CM

## 2019-02-28 NOTE — Progress Notes (Signed)
   Covid-19 Vaccination Clinic  Name:  Michele Freeman    MRN: [id number] DOB: 09/16/1965  02/28/2019  [name] was observed post Covid-19 immunization for 15 minutes without incidence. She was provided with Vaccine Information Sheet and instruction to access the V-Safe system.   [name] was instructed to call 911 with any severe reactions post vaccine: Marland Kitchen Difficulty breathing  . Swelling of your face and throat  . A fast heartbeat  . A bad rash all over your body  . Dizziness and weakness    Immunizations Administered    Name Date Dose VIS Date Route   Moderna COVID-19 Vaccine 02/28/2019 12:52 PM 0.5 mL 12/02/2018 Intramuscular   Manufacturer: Moderna   Lot: [id number]   NDC: 14103-013-14

## 2019-03-28 ENCOUNTER — Ambulatory Visit: Payer: 59 | Attending: Internal Medicine

## 2019-03-28 DIAGNOSIS — Z23 Encounter for immunization: Secondary | ICD-10-CM

## 2019-03-28 NOTE — Progress Notes (Signed)
   Covid-19 Vaccination Clinic  Name:  BETHENE HANKINSON    MRN: [id number] DOB: 1965-01-11  03/28/2019  Ms. Swingler was observed post Covid-19 immunization for 15 minutes without incident. She was provided with Vaccine Information Sheet and instruction to access the V-Safe system.   Ms. Mignogna was instructed to call 911 with any severe reactions post vaccine: Marland Kitchen Difficulty breathing  . Swelling of face and throat  . A fast heartbeat  . A bad rash all over body  . Dizziness and weakness   Immunizations Administered    Name Date Dose VIS Date Route   Moderna COVID-19 Vaccine 03/28/2019 12:44 PM 0.5 mL 12/02/2018 Intramuscular   Manufacturer: Gala Murdoch   Lot: [id number]   NDC: 38453-646-80

## 2019-09-10 ENCOUNTER — Other Ambulatory Visit: Payer: Self-pay | Admitting: Physician Assistant

## 2019-09-10 DIAGNOSIS — Z1231 Encounter for screening mammogram for malignant neoplasm of breast: Secondary | ICD-10-CM

## 2019-09-30 ENCOUNTER — Other Ambulatory Visit: Payer: Self-pay

## 2019-09-30 ENCOUNTER — Ambulatory Visit
Admission: RE | Admit: 2019-09-30 | Discharge: 2019-09-30 | Disposition: A | Discharge status: Home / Self Care | Payer: 59 | Source: Ambulatory Visit | Attending: Physician Assistant | Admitting: Physician Assistant

## 2019-09-30 DIAGNOSIS — Z1231 Encounter for screening mammogram for malignant neoplasm of breast: Secondary | ICD-10-CM | POA: Diagnosis present

## 2021-01-30 ENCOUNTER — Other Ambulatory Visit: Payer: Self-pay

## 2021-01-30 ENCOUNTER — Ambulatory Visit (LOCAL_COMMUNITY_HEALTH_CENTER): Payer: Self-pay

## 2021-01-30 DIAGNOSIS — Z111 Encounter for screening for respiratory tuberculosis: Secondary | ICD-10-CM

## 2021-02-02 ENCOUNTER — Ambulatory Visit (LOCAL_COMMUNITY_HEALTH_CENTER): Payer: Self-pay

## 2021-02-02 ENCOUNTER — Other Ambulatory Visit: Payer: Self-pay

## 2021-02-02 DIAGNOSIS — Z111 Encounter for screening for respiratory tuberculosis: Secondary | ICD-10-CM

## 2021-02-02 LAB — TB SKIN TEST
Induration: 6 mm
TB Skin Test: NEGATIVE

## 2021-02-02 NOTE — Progress Notes (Signed)
PPDR 6 mm, Negative today. Getting PPD for childcare job. Hx diabetes. Jerel Shepherd, RN

## 2021-03-13 ENCOUNTER — Other Ambulatory Visit: Payer: Self-pay | Admitting: Physician Assistant

## 2021-03-13 DIAGNOSIS — Z1231 Encounter for screening mammogram for malignant neoplasm of breast: Secondary | ICD-10-CM

## 2021-04-24 ENCOUNTER — Inpatient Hospital Stay: Admission: RE | Admit: 2021-04-24 | Payer: 59 | Source: Ambulatory Visit

## 2021-11-30 ENCOUNTER — Emergency Department: Payer: Commercial Managed Care - HMO

## 2021-11-30 ENCOUNTER — Other Ambulatory Visit: Payer: Self-pay

## 2021-11-30 ENCOUNTER — Emergency Department
Admission: EM | Admit: 2021-11-30 | Discharge: 2021-11-30 | Disposition: A | Discharge status: Home / Self Care | Payer: Commercial Managed Care - HMO | Attending: Emergency Medicine | Admitting: Emergency Medicine

## 2021-11-30 DIAGNOSIS — W01198A Fall on same level from slipping, tripping and stumbling with subsequent striking against other object, initial encounter: Secondary | ICD-10-CM | POA: Diagnosis not present

## 2021-11-30 DIAGNOSIS — S0003XA Contusion of scalp, initial encounter: Secondary | ICD-10-CM | POA: Diagnosis not present

## 2021-11-30 DIAGNOSIS — S63502A Unspecified sprain of left wrist, initial encounter: Secondary | ICD-10-CM | POA: Diagnosis not present

## 2021-11-30 DIAGNOSIS — W19XXXA Unspecified fall, initial encounter: Secondary | ICD-10-CM

## 2021-11-30 DIAGNOSIS — Y990 Civilian activity done for income or pay: Secondary | ICD-10-CM | POA: Diagnosis not present

## 2021-11-30 DIAGNOSIS — S6992XA Unspecified injury of left wrist, hand and finger(s), initial encounter: Secondary | ICD-10-CM | POA: Diagnosis present

## 2021-11-30 MED ORDER — HYDROCODONE-ACETAMINOPHEN 5-325 MG PO TABS
1.0000 | ORAL_TABLET | Freq: Once | ORAL | Status: AC
  Administered 2021-11-30: 1 via ORAL
  Filled 2021-11-30: qty 1

## 2021-11-30 MED ORDER — MELOXICAM 15 MG PO TABS: 15.0000 mg | ORAL_TABLET | Freq: Every day | ORAL | 0 refills | Status: AC

## 2021-11-30 NOTE — ED Triage Notes (Signed)
C/O mechanical fall, fell backward hitting head on wooden gym floor.  Also c/o left wrist pain. Denies LOC  AAOx3.  Skin warm and dry. NAD.

## 2021-11-30 NOTE — ED Provider Notes (Signed)
[facility] Provider Note  Patient Contact: 8:47 PM (approximate)   History   Fall   HPI  Michele Freeman is a 56 y.o. female who presents the emergency department after a mechanical fall.  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  Patient tried to catch himself with an outstretched left hand but did hit her head and did lose consciousness.  Patient was only out briefly.  Endorses a posterior headache, left wrist pain.  No other injury or complaint.  No medications prior to arrival.     Physical Exam   Triage Vital Signs: ED Triage Vitals  Enc Vitals Group     BP 11/30/21 1820 (!) 183/102     Pulse Rate 11/30/21 1820 77     Resp 11/30/21 1820 18     Temp 11/30/21 1820 98.2 F (36.8 C)     Temp Source 11/30/21 1820 Oral     SpO2 11/30/21 1820 98 %     Weight 11/30/21 1756 179 lb 14.3 oz (81.6 kg)     Height 11/30/21 1756 5\' 7"  (1.702 m)     Head Circumference --      Peak Flow --      Pain Score 11/30/21 1756 8     Pain Loc --      Pain Edu? --      Excl. in GC? --     Most recent vital signs: Vitals:   11/30/21 1820  BP: (!) 183/102  Pulse: 77  Resp: 18  Temp: 98.2 F (36.8 C)  SpO2: 98%     General: Alert and in no acute distress. Eyes:  PERRL. EOMI. Head: Small hematoma to right occipital skull.  No open wounds.  No abrasions, lacerations.  No battle signs, raccoon eyes, serosanguineous wound drainage from the ears or nares.  Neck: No stridor. No cervical spine tenderness to palpation.  Cardiovascular:  Good peripheral perfusion Respiratory: Normal respiratory effort without tachypnea or retractions. Lungs CTAB. Musculoskeletal: Full range of motion to all extremities.  Edema noted to the left wrist without deformity.  Patient has limited range of motion due to pain.  Tenderness over the distal radius and ulna without palpable  abnormality.  Pulse intact.  Sensation intact all digits. Neurologic:  No gross focal neurologic deficits are appreciated.  Cranial nerves II through XII grossly intact.  Negative Romberg's and pronator drift. Skin:   No rash noted Other:   ED Results / Procedures / Treatments   Labs (all labs ordered are listed, but only abnormal results are displayed) Labs Reviewed - No data to display   EKG     RADIOLOGY  I personally viewed, evaluated, and interpreted these images as part of my medical decision making, as well as reviewing the written report by the radiologist.  ED Provider Interpretation: No acute intracranial hemorrhage on CT scan.  No evidence of skull fracture.  Left wrist reveals no evidence of fracture or dislocation.  CT Head Wo Contrast  Result Date: 11/30/2021 CLINICAL DATA:  Recent fall with headaches and neck pain, initial encounter EXAM: CT HEAD WITHOUT CONTRAST CT CERVICAL SPINE WITHOUT CONTRAST TECHNIQUE: Multidetector CT imaging of the head and cervical spine was performed following the standard protocol without intravenous contrast. Multiplanar CT image reconstructions of the cervical spine were also generated. RADIATION DOSE REDUCTION: This exam was performed according to the departmental dose-optimization  program which includes automated exposure control, adjustment of the mA and/or kV according to patient size and/or use of iterative reconstruction technique. COMPARISON:  None Available. FINDINGS: CT HEAD FINDINGS Brain: No evidence of acute infarction, hemorrhage, hydrocephalus, extra-axial collection or mass lesion/mass effect. Vascular: No hyperdense vessel or unexpected calcification. Skull: Normal. Negative for fracture or focal lesion. Sinuses/Orbits: No acute finding. Other: None. CT CERVICAL SPINE FINDINGS Alignment: Straightening of the normal cervical lordosis is noted. This may be related to muscular spasm. Skull base and vertebrae: 7 cervical segments are  well visualized. Vertebral body height is well maintained. Mild osteophytic changes are seen. No acute fracture or acute facet abnormality is noted. Soft tissues and spinal canal: Surrounding soft tissue structures demonstrates significant enlargement of the left lobe of the thyroid. There is a 2.7 cm mildly hypodense nodule identified on the left. This is similar to that seen on the prior ultrasound examination from 2013. Upper chest: Visualized lung apices are within normal limits. Other: None IMPRESSION: CT of the head: No acute intracranial abnormality noted. CT of cervical spine: Multilevel degenerative changes without acute abnormality. Stable 2.7 cm hypodense nodule in the left lobe of the thyroid. This has been evaluated on previous imaging. (ref: J Am Coll Radiol. 2015 Feb;12(2): 143-50). Electronically Signed   By: Alcide Clever M.D.   On: 11/30/2021 19:22   CT Cervical Spine Wo Contrast  Result Date: 11/30/2021 CLINICAL DATA:  Recent fall with headaches and neck pain, initial encounter EXAM: CT HEAD WITHOUT CONTRAST CT CERVICAL SPINE WITHOUT CONTRAST TECHNIQUE: Multidetector CT imaging of the head and cervical spine was performed following the standard protocol without intravenous contrast. Multiplanar CT image reconstructions of the cervical spine were also generated. RADIATION DOSE REDUCTION: This exam was performed according to the departmental dose-optimization program which includes automated exposure control, adjustment of the mA and/or kV according to patient size and/or use of iterative reconstruction technique. COMPARISON:  None Available. FINDINGS: CT HEAD FINDINGS Brain: No evidence of acute infarction, hemorrhage, hydrocephalus, extra-axial collection or mass lesion/mass effect. Vascular: No hyperdense vessel or unexpected calcification. Skull: Normal. Negative for fracture or focal lesion. Sinuses/Orbits: No acute finding. Other: None. CT CERVICAL SPINE FINDINGS Alignment: Straightening  of the normal cervical lordosis is noted. This may be related to muscular spasm. Skull base and vertebrae: 7 cervical segments are well visualized. Vertebral body height is well maintained. Mild osteophytic changes are seen. No acute fracture or acute facet abnormality is noted. Soft tissues and spinal canal: Surrounding soft tissue structures demonstrates significant enlargement of the left lobe of the thyroid. There is a 2.7 cm mildly hypodense nodule identified on the left. This is similar to that seen on the prior ultrasound examination from 2013. Upper chest: Visualized lung apices are within normal limits. Other: None IMPRESSION: CT of the head: No acute intracranial abnormality noted. CT of cervical spine: Multilevel degenerative changes without acute abnormality. Stable 2.7 cm hypodense nodule in the left lobe of the thyroid. This has been evaluated on previous imaging. (ref: J Am Coll Radiol. 2015 Feb;12(2): 143-50). Electronically Signed   By: Alcide Clever M.D.   On: 11/30/2021 19:22   DG Wrist Complete Left  Result Date: 11/30/2021 CLINICAL DATA:  Fall with wrist pain. EXAM: LEFT WRIST - COMPLETE 3+ VIEW COMPARISON:  None Available. FINDINGS: There is no evidence of fracture or dislocation. Mild osteoarthritis of the first carpometacarpal joint. Soft tissues are unremarkable. IMPRESSION: 1. No acute osseous injury. Electronically Signed   By: Joselyn Glassman  Litton M.D.   On: 11/30/2021 18:17    PROCEDURES:  Critical Care performed: No  Procedures   MEDICATIONS ORDERED IN ED: Medications  HYDROcodone-acetaminophen (NORCO/VICODIN) 5-325 MG per tablet 1 tablet (1 tablet Oral Given 11/30/21 2055)     IMPRESSION / MDM / ASSESSMENT AND PLAN / ED COURSE  I reviewed the triage vital signs and the nursing notes.                              Differential diagnosis includes, but is not limited to, skull fracture, intracranial hemorrhage, scalp hematoma, concussion, wrist fracture, wrist  sprain  Patient's presentation is most consistent with acute presentation with potential threat to life or bodily function.   Patient's diagnosis is consistent with scalp hematoma, or sprain.  Patient presents emergency department after mechanical fall.  She tripped and fell backwards hitting her head.  She did have a very brief period of loss of consciousness.  Patient was neurologically intact on physical exam.  Did have a slight hematoma to the right occipital skull.  Imaging of the head and neck were reassuring with no fracture of the cervical spine, no intracranial hemorrhage, skull fracture.  X-ray of the left wrist is reassuring with no fracture.  At this time patient is given Velcro brace for some control of left wrist.  Anti-inflammatory for headache and has not for sprain.  Concerning signs and symptoms to return to the ED as discussed.  Follow-up primary care as needed..  Patient is given ED precautions to return to the ED for any worsening or new symptoms.        FINAL CLINICAL IMPRESSION(S) / ED DIAGNOSES   Final diagnoses:  Fall, initial encounter  Hematoma of scalp, initial encounter  Sprain of left wrist, initial encounter     Rx / DC Orders   ED Discharge Orders          Ordered    meloxicam (MOBIC) 15 MG tablet  Daily        11/30/21 2214             Note:  This document was prepared using Dragon voice recognition software and may include unintentional dictation errors.   Racheal Patches, PA-C 11/30/21 2344    Concha Se, MD 12/01/21 1100

## 2022-03-16 DIAGNOSIS — I10 Essential (primary) hypertension: Secondary | ICD-10-CM | POA: Diagnosis not present

## 2022-03-16 DIAGNOSIS — E782 Mixed hyperlipidemia: Secondary | ICD-10-CM | POA: Diagnosis not present

## 2022-03-16 DIAGNOSIS — Z0000 Encounter for general adult medical examination without abnormal findings: Secondary | ICD-10-CM | POA: Diagnosis not present

## 2022-03-16 DIAGNOSIS — E119 Type 2 diabetes mellitus without complications: Secondary | ICD-10-CM | POA: Diagnosis not present

## 2022-03-19 DIAGNOSIS — Z1231 Encounter for screening mammogram for malignant neoplasm of breast: Secondary | ICD-10-CM | POA: Diagnosis not present

## 2022-03-19 DIAGNOSIS — E782 Mixed hyperlipidemia: Secondary | ICD-10-CM | POA: Diagnosis not present

## 2022-03-19 DIAGNOSIS — E0590 Thyrotoxicosis, unspecified without thyrotoxic crisis or storm: Secondary | ICD-10-CM | POA: Diagnosis not present

## 2022-03-19 DIAGNOSIS — I5022 Chronic systolic (congestive) heart failure: Secondary | ICD-10-CM | POA: Diagnosis not present

## 2022-03-19 DIAGNOSIS — I10 Essential (primary) hypertension: Secondary | ICD-10-CM | POA: Diagnosis not present

## 2022-03-19 DIAGNOSIS — Z0000 Encounter for general adult medical examination without abnormal findings: Secondary | ICD-10-CM | POA: Diagnosis not present

## 2022-03-19 DIAGNOSIS — E119 Type 2 diabetes mellitus without complications: Secondary | ICD-10-CM | POA: Diagnosis not present

## 2022-03-20 DIAGNOSIS — E1159 Type 2 diabetes mellitus with other circulatory complications: Secondary | ICD-10-CM | POA: Diagnosis not present

## 2022-03-20 DIAGNOSIS — I152 Hypertension secondary to endocrine disorders: Secondary | ICD-10-CM | POA: Diagnosis not present

## 2022-03-20 DIAGNOSIS — E0590 Thyrotoxicosis, unspecified without thyrotoxic crisis or storm: Secondary | ICD-10-CM | POA: Diagnosis not present

## 2022-03-20 DIAGNOSIS — E1169 Type 2 diabetes mellitus with other specified complication: Secondary | ICD-10-CM | POA: Diagnosis not present

## 2022-03-20 DIAGNOSIS — E785 Hyperlipidemia, unspecified: Secondary | ICD-10-CM | POA: Diagnosis not present

## 2022-03-20 DIAGNOSIS — E042 Nontoxic multinodular goiter: Secondary | ICD-10-CM | POA: Diagnosis not present

## 2022-03-20 DIAGNOSIS — E1165 Type 2 diabetes mellitus with hyperglycemia: Secondary | ICD-10-CM | POA: Diagnosis not present

## 2022-04-04 DIAGNOSIS — H35033 Hypertensive retinopathy, bilateral: Secondary | ICD-10-CM | POA: Diagnosis not present

## 2022-05-09 ENCOUNTER — Other Ambulatory Visit: Payer: Self-pay | Admitting: Physician Assistant

## 2022-05-09 DIAGNOSIS — E042 Nontoxic multinodular goiter: Secondary | ICD-10-CM | POA: Diagnosis not present

## 2022-05-09 DIAGNOSIS — Z1231 Encounter for screening mammogram for malignant neoplasm of breast: Secondary | ICD-10-CM

## 2022-06-12 ENCOUNTER — Ambulatory Visit
Admission: RE | Admit: 2022-06-12 | Discharge: 2022-06-12 | Disposition: A | Discharge status: Home / Self Care | Payer: 59 | Source: Ambulatory Visit | Attending: Physician Assistant | Admitting: Physician Assistant

## 2022-06-12 DIAGNOSIS — Z1231 Encounter for screening mammogram for malignant neoplasm of breast: Secondary | ICD-10-CM | POA: Insufficient documentation

## 2022-10-26 ENCOUNTER — Other Ambulatory Visit: Payer: Self-pay

## 2022-10-26 ENCOUNTER — Emergency Department
Admission: EM | Admit: 2022-10-26 | Discharge: 2022-10-26 | Disposition: A | Discharge status: Home / Self Care | Payer: 59 | Attending: Student in an Organized Health Care Education/Training Program | Admitting: Student in an Organized Health Care Education/Training Program

## 2022-10-26 DIAGNOSIS — I110 Hypertensive heart disease with heart failure: Secondary | ICD-10-CM | POA: Diagnosis not present

## 2022-10-26 DIAGNOSIS — I509 Heart failure, unspecified: Secondary | ICD-10-CM | POA: Diagnosis not present

## 2022-10-26 DIAGNOSIS — R519 Headache, unspecified: Secondary | ICD-10-CM | POA: Diagnosis not present

## 2022-10-26 DIAGNOSIS — R112 Nausea with vomiting, unspecified: Secondary | ICD-10-CM | POA: Insufficient documentation

## 2022-10-26 DIAGNOSIS — E119 Type 2 diabetes mellitus without complications: Secondary | ICD-10-CM | POA: Diagnosis not present

## 2022-10-26 DIAGNOSIS — Z20822 Contact with and (suspected) exposure to covid-19: Secondary | ICD-10-CM | POA: Diagnosis not present

## 2022-10-26 LAB — COMPREHENSIVE METABOLIC PANEL
ALT: 20 U/L (ref 0–44)
AST: 14 U/L — ABNORMAL LOW (ref 15–41)
Albumin: 4.4 g/dL (ref 3.5–5.0)
Alkaline Phosphatase: 81 U/L (ref 38–126)
Anion gap: 11 (ref 5–15)
BUN: 15 mg/dL (ref 6–20)
CO2: 26 mmol/L (ref 22–32)
Calcium: 9.2 mg/dL (ref 8.9–10.3)
Chloride: 102 mmol/L (ref 98–111)
Creatinine, Ser: 0.68 mg/dL (ref 0.44–1.00)
GFR, Estimated: >60 mL/min (ref >60)
Glucose, Bld: 134 mg/dL — ABNORMAL HIGH (ref 70–99)
Potassium: 3.9 mmol/L (ref 3.5–5.1)
Sodium: 139 mmol/L (ref 135–145)
Total Bilirubin: 1 mg/dL (ref 0.3–1.2)
Total Protein: 7.7 g/dL (ref 6.5–8.1)

## 2022-10-26 LAB — URINALYSIS, ROUTINE W REFLEX MICROSCOPIC
Bilirubin Urine: NEGATIVE
Glucose, UA: NEGATIVE mg/dL
Hgb urine dipstick: NEGATIVE
Ketones, ur: 20 mg/dL — AB
Nitrite: NEGATIVE
Protein, ur: 30 mg/dL — AB
Specific Gravity, Urine: 1.019 (ref 1.005–1.030)
pH: 7 (ref 5.0–8.0)

## 2022-10-26 LAB — CBC
HCT: 43.9 % (ref 36.0–46.0)
Hemoglobin: 14.5 g/dL (ref 12.0–15.0)
MCH: 30.3 pg (ref 26.0–34.0)
MCHC: 33 g/dL (ref 30.0–36.0)
MCV: 91.8 fL (ref 80.0–100.0)
Platelets: 286 10*3/uL (ref 150–400)
RBC: 4.78 MIL/uL (ref 3.87–5.11)
RDW: 12.4 % (ref 11.5–15.5)
WBC: 9.6 10*3/uL (ref 4.0–10.5)
nRBC: 0 % (ref 0.0–0.2)

## 2022-10-26 LAB — RESP PANEL BY RT-PCR (RSV, FLU A&B, COVID)  RVPGX2
Influenza A by PCR: NEGATIVE
Influenza B by PCR: NEGATIVE
Resp Syncytial Virus by PCR: NEGATIVE
SARS Coronavirus 2 by RT PCR: NEGATIVE

## 2022-10-26 LAB — LIPASE, BLOOD: Lipase: 30 U/L (ref 11–51)

## 2022-10-26 MED ORDER — ONDANSETRON 4 MG PO TBDP
4.0000 mg | ORAL_TABLET | Freq: Once | ORAL | Status: AC
  Administered 2022-10-26: 4 mg via ORAL
  Filled 2022-10-26: qty 1

## 2022-10-26 MED ORDER — ONDANSETRON 4 MG PO TBDP: 4.0000 mg | ORAL_TABLET | Freq: Three times a day (TID) | ORAL | 0 refills | Status: AC | PRN

## 2022-10-26 MED ORDER — ACETAMINOPHEN 325 MG PO TABS
650.0000 mg | ORAL_TABLET | Freq: Once | ORAL | Status: AC
  Administered 2022-10-26: 650 mg via ORAL
  Filled 2022-10-26: qty 2

## 2022-10-26 NOTE — ED Provider Notes (Signed)
[facility] Provider Note    Event Date/Time   First MD Initiated Contact with Patient 10/26/22 2034     (approximate)   History   Headache and Emesis   HPI  AAYRA TRACH is a 57 y.o. 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  No diarrhea.  No fevers but has had chills.  States that several children are sick with similar symptoms and she likely got similar illness.  She denies any cough or congestion.  Denies any abdominal pain.  No dysuria.     Physical Exam   Triage Vital Signs: ED Triage Vitals  Encounter Vitals Group     BP 10/26/22 1914 (!) 169/98     Systolic BP Percentile --      Diastolic BP Percentile --      Pulse Rate 10/26/22 1911 92     Resp 10/26/22 1911 18     Temp 10/26/22 1911 98.2 F (36.8 C)     Temp src --      SpO2 10/26/22 1911 98 %     Weight 10/26/22 1912 185 lb (83.9 kg)     Height 10/26/22 1912 5\' 7"  (1.702 m)     Head Circumference --      Peak Flow --      Pain Score 10/26/22 1912 8     Pain Loc --      Pain Education --      Exclude from Growth Chart --     Most recent vital signs: Vitals:   10/26/22 1911 10/26/22 1914  BP:  (!) 169/98  Pulse: 92   Resp: 18   Temp: 98.2 F (36.8 C)   SpO2: 98%      Constitutional: Alert  Eyes: Conjunctivae are normal.  Head: Atraumatic. Nose: No congestion/rhinnorhea. Mouth/Throat: Mucous membranes are moist.   Neck: Painless ROM.  Cardiovascular:   Good peripheral circulation. Respiratory: Normal respiratory effort.  No retractions.  Gastrointestinal: Soft and nontender.  Musculoskeletal:  no deformity Neurologic:  MAE spontaneously. No gross focal neurologic deficits are appreciated.  Skin:  Skin is warm, dry and intact. No rash noted. Psychiatric: Mood and affect are normal. Speech and behavior are normal.    ED Results / Procedures / Treatments    Labs (all labs ordered are listed, but only abnormal results are displayed) Labs Reviewed  COMPREHENSIVE METABOLIC PANEL - Abnormal; Notable for the following components:      Result Value   Glucose, Bld 134 (*)    AST 14 (*)    All other components within normal limits  URINALYSIS, ROUTINE W REFLEX MICROSCOPIC - Abnormal; Notable for the following components:   Color, Urine YELLOW (*)    APPearance CLOUDY (*)    Ketones, ur 20 (*)    Protein, ur 30 (*)    Leukocytes,Ua SMALL (*)    Bacteria, UA RARE (*)    All other components within normal limits  RESP PANEL BY RT-PCR (RSV, FLU A&B, COVID)  RVPGX2  LIPASE, BLOOD  CBC     EKG     RADIOLOGY   PROCEDURES:  Critical Care performed:   Procedures   MEDICATIONS ORDERED IN ED: Medications  ondansetron (ZOFRAN-ODT) disintegrating tablet 4 mg (4 mg Oral Given 10/26/22 2053)  acetaminophen (TYLENOL) tablet 650 mg (650 mg Oral Given 10/26/22 2053)     IMPRESSION / MDM / ASSESSMENT AND PLAN /  ED COURSE  I reviewed the triage vital signs and the nursing notes.                              Differential diagnosis includes, but is not limited to, URI, sinusitis, viral illness, dehydration, electrolyte abnormality, flu, COVID  Patient presenting to the ER for evaluation of symptoms as described above.  Based on symptoms, risk factors and considered above differential, this presenting complaint could reflect a potentially life-threatening illness therefore the patient will be placed on continuous pulse oximetry and telemetry for monitoring.  Laboratory evaluation will be sent to evaluate for the above complaints.  Blood work is reassuring.  No UTI.  Not consistent with meningitis.  RVP is negative.  Suspect viral illness gastroenteritis.  Symptoms improved with oral antiemetic and symptomatic management.  Do not feel that neuroimaging clinically indicated.  Does appear stable and appropriate for discharge strict return  precautions.  Patient agreeable plan.        FINAL CLINICAL IMPRESSION(S) / ED DIAGNOSES   Final diagnoses:  Nausea and vomiting, unspecified vomiting type     Rx / DC Orders   ED Discharge Orders          Ordered    ondansetron (ZOFRAN-ODT) 4 MG disintegrating tablet  Every 8 hours PRN        10/26/22 2051             Note:  This document was prepared using Dragon voice recognition software and may include unintentional dictation errors.    Willy Eddy, MD 10/26/22 [phone]

## 2022-10-26 NOTE — ED Triage Notes (Addendum)
Pt arrives with reports of headache, congestion and emesis for the last couple days. Pt reports poor PO intake and reports being around sick children. Pt denies taking any medication for headache.

## 2023-03-20 ENCOUNTER — Other Ambulatory Visit: Payer: Self-pay | Admitting: Physician Assistant

## 2023-03-20 DIAGNOSIS — Z1231 Encounter for screening mammogram for malignant neoplasm of breast: Secondary | ICD-10-CM

## 2023-10-16 ENCOUNTER — Encounter
